# Patient Record
Sex: Male | Born: 1965 | Race: White | Hispanic: No | Marital: Single | Smoking: Former smoker
Health system: Southern US, Community
[De-identification: ages and names within clinical notes are randomized; demographics above are authoritative.]

## PROBLEM LIST (undated history)

## (undated) DIAGNOSIS — I10 Essential (primary) hypertension: Secondary | ICD-10-CM

## (undated) DIAGNOSIS — G4733 Obstructive sleep apnea (adult) (pediatric): Secondary | ICD-10-CM

---

## 2005-07-04 ENCOUNTER — Emergency Department (HOSPITAL_COMMUNITY): Admission: EM | Admit: 2005-07-04 | Discharge: 2005-07-04 | Payer: Self-pay | Admitting: Emergency Medicine

## 2006-12-25 IMAGING — CT CT ABDOMEN W/O CM
1 series · 15 of 32 positions shown, 19 images · non-contrast
Comparison: none

CLINICAL DATA: Right flank pain and dysuria.  
ABDOMEN CT WITHOUT CONTRAST ? 07/04/05:
TECHNIQUE: Multidetector CT imaging of the abdomen was performed following the standard protocol without IV contrast. 
No prior studies.
TECHNIQUE: Multidetector CT imaging of the pelvis was performed following the standard protocol without IV contrast.

[Series 9238: — · axial · 0.98mm/px · z∈[+1432,+1872]mm · 15 of 99 slices shown, 19 images]
[im 7/99  soft-tissue]
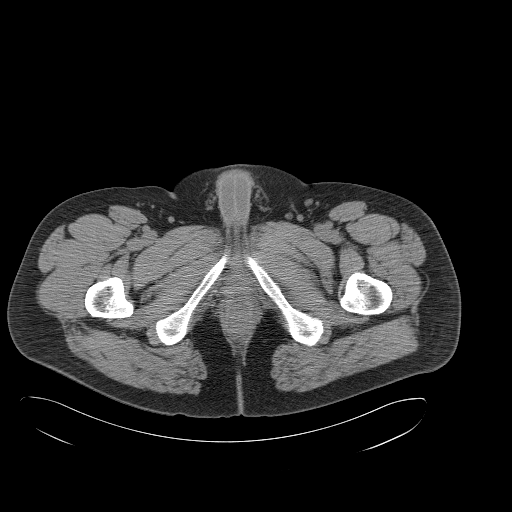
[im 7/99  bone]
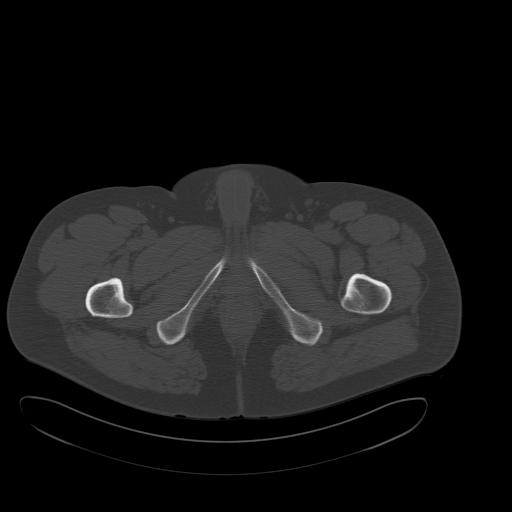
[im 13/99  soft-tissue]
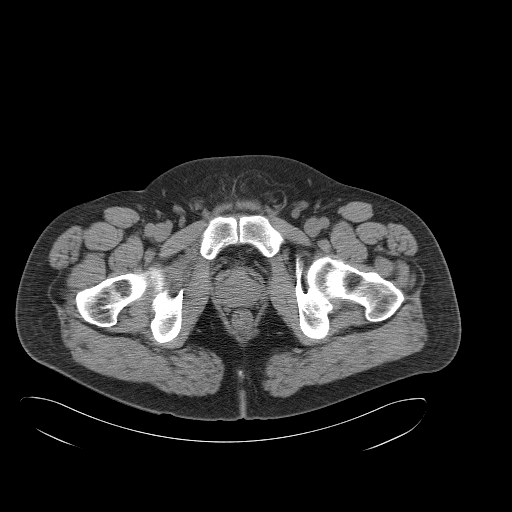
[im 19/99  soft-tissue]
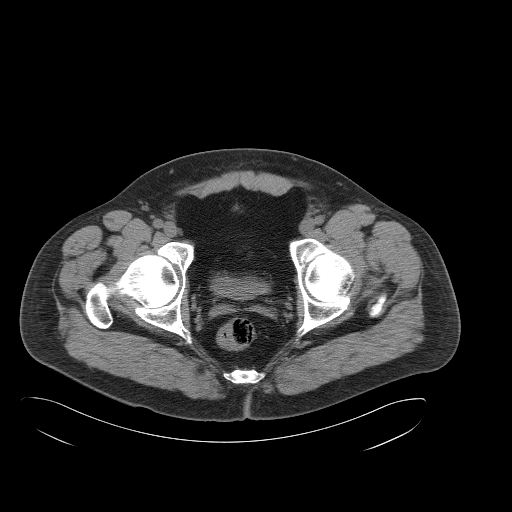
[im 29/99  soft-tissue]
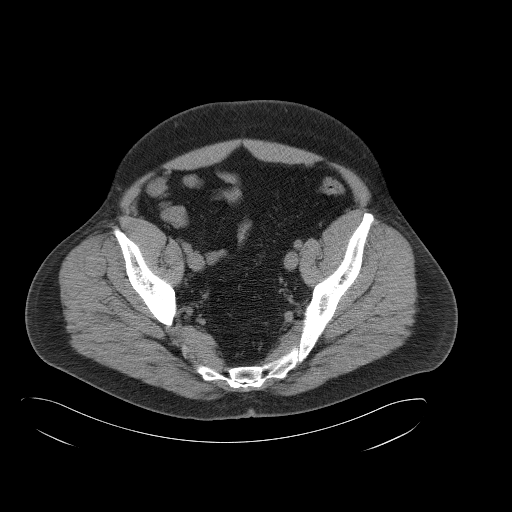
[im 35/99  soft-tissue]
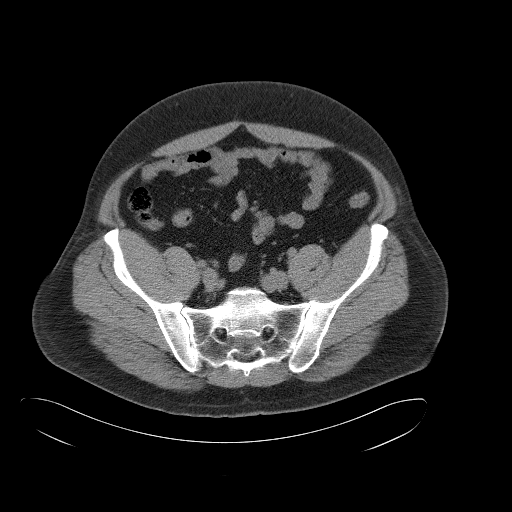
[im 42/99  soft-tissue]
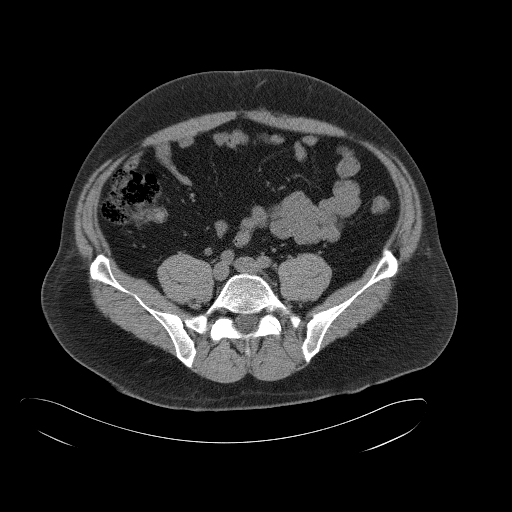
[im 51/99  soft-tissue]
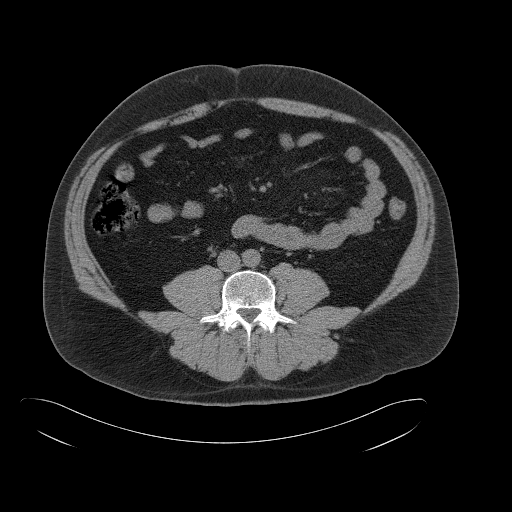
[im 57/99  soft-tissue]
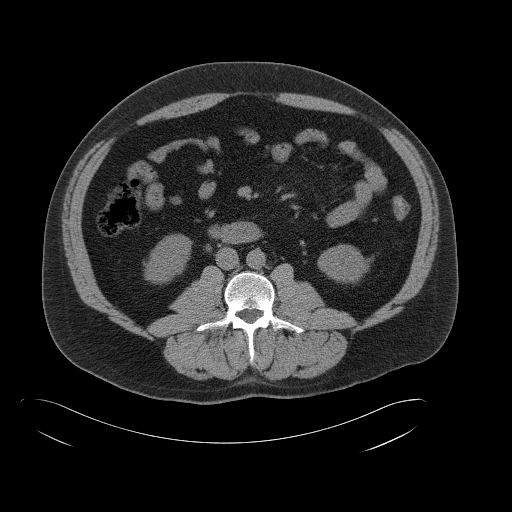
[im 64/99  soft-tissue]
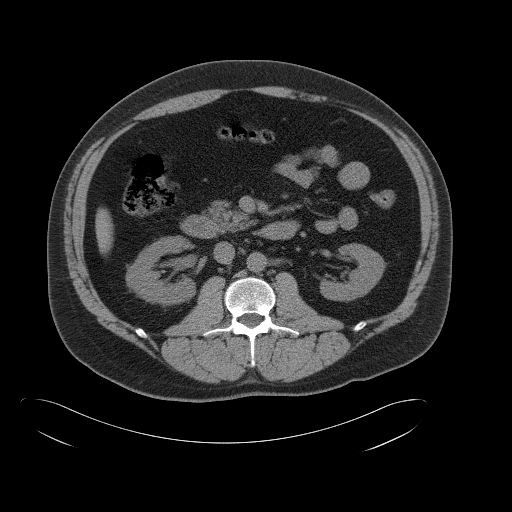
[im 64/99  bone]
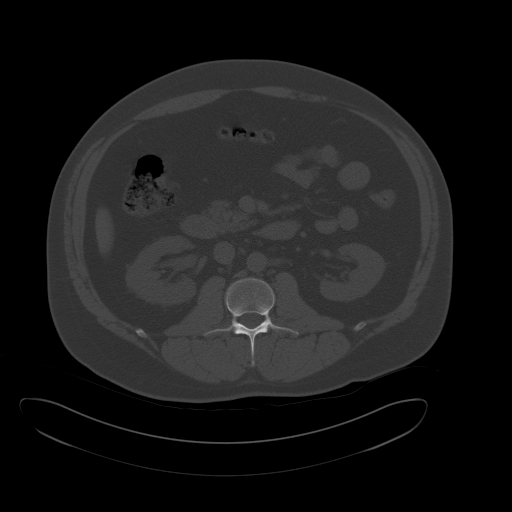
[im 70/99  soft-tissue]
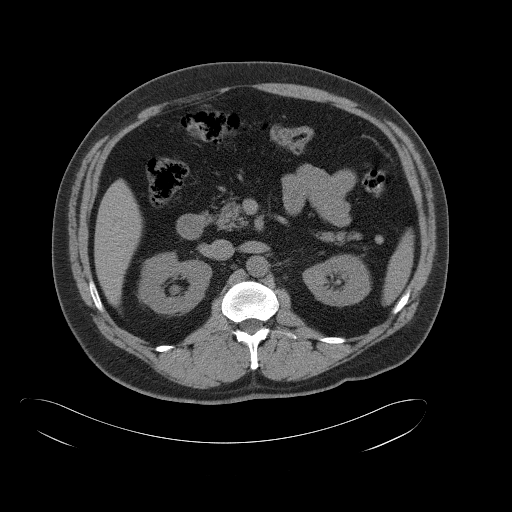
[im 80/99  soft-tissue]
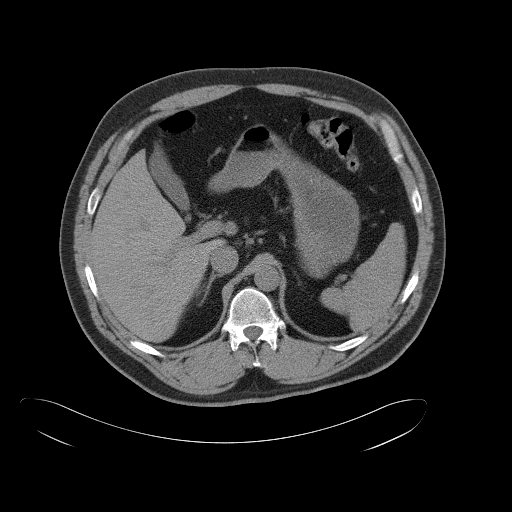
[im 86/99  soft-tissue]
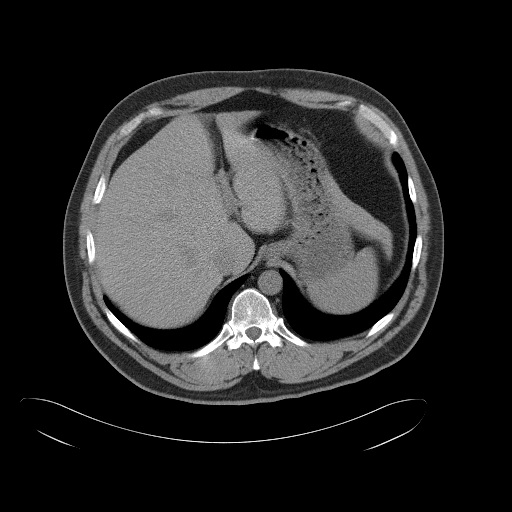
[im 86/99  lung]
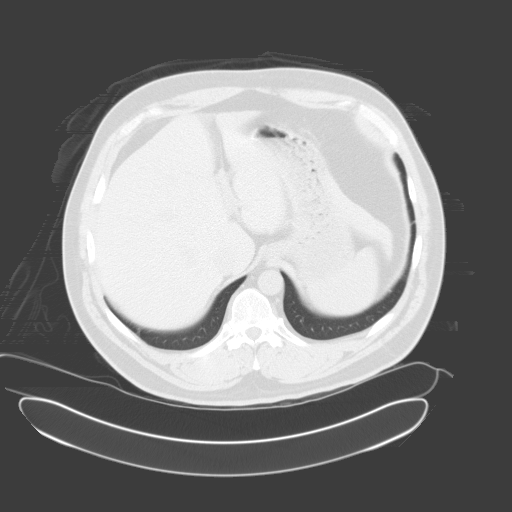
[im 89/99  lung]
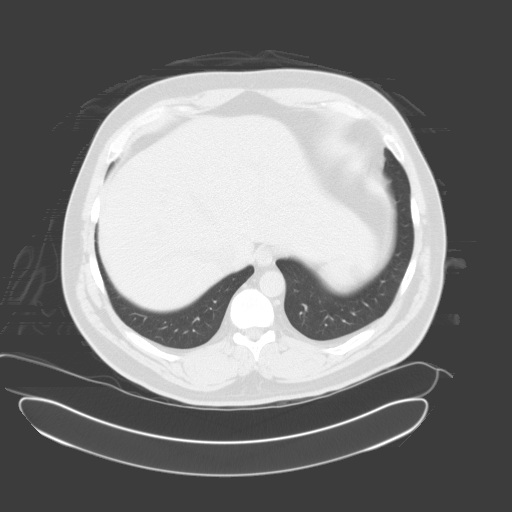
[im 92/99  soft-tissue]
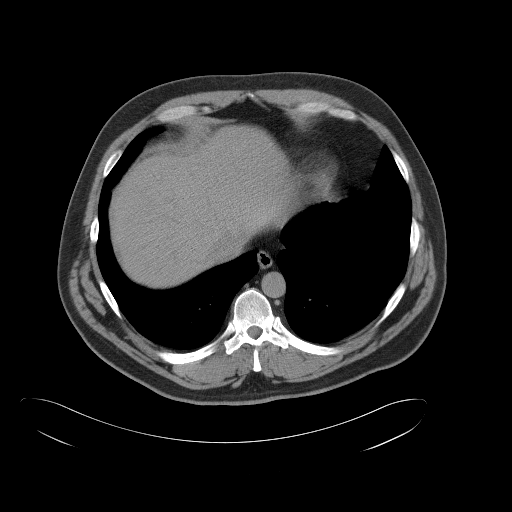
[im 92/99  lung]
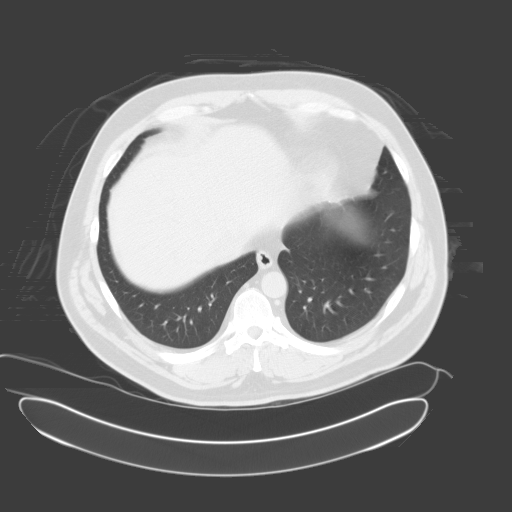
[im 95/99  lung]
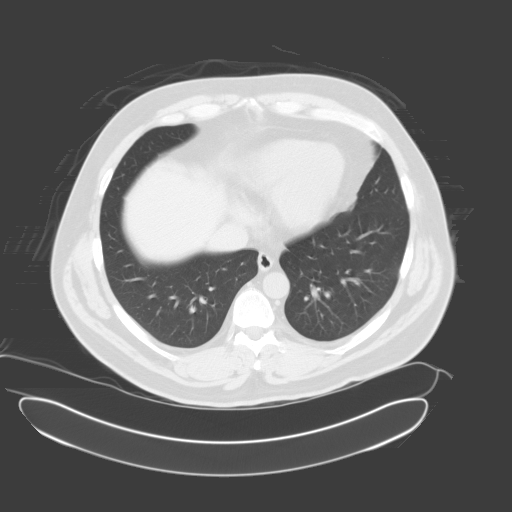

[15 of 32 positions shown; findings below may reference images not displayed]

FINDINGS: The liver, gallbladder, spleen, pancreas, and adrenal glands are unremarkable in the noncontrast CT appearance.  A celiac chain node measures 2.3 x 1.1 cm which is mildly enlarged.  A precaval node measures 3.0 x 0.9 cm.  
There is asymmetric right-sided perirenal and periureteral stranding along with mild right hydroureter extending down to a 1-2 mm right ureterovesical junction calculus.  No additional calculi are identified.  There is only mild fullness of the right collecting system.
IMPRESSION: 1.  Borderline right hydroureter due to a 1-2 mm partially obstructive right ureterovesical junction calculus.  There is mild right perirenal stranding but no overt hydronephrosis.
2.  Mildly enlarged upper abdominal lymph nodes, which may be reactive but are technically nonspecific.
PELVIS CT WITHOUT CONTRAST ? 07/04/05:
FINDINGS: The appendix appears normal.  There is borderline right hydroureter extending down to a very faint density at the right ureterovesical junction likely representing a 1-2 mm calculus.  The sigmoid colon appears unremarkable.  There is no significant inguinal or pelvic sidewall adenopathy.
IMPRESSION: 1.  1-2 mm partially obstructive right UVJ calculus.
2.  The appendix appears normal.

## 2008-03-07 ENCOUNTER — Encounter: Admission: RE | Admit: 2008-03-07 | Discharge: 2008-03-07 | Payer: Self-pay | Admitting: Gastroenterology

## 2009-08-28 IMAGING — US US ABDOMEN COMPLETE
1 series · 13 of 25 positions shown · non-contrast
Comparison: CT abdomen pelvis of 07/04/2005

CLINICAL DATA: C

ABDOMEN ULTRASOUND
TECHNIQUE: Complete abdominal ultrasound examination was performed
including evaluation of the liver, gallbladder, bile ducts,
pancreas, kidneys, spleen, IVC, and abdominal aorta.

[Series 1: us abdomen complete · 0.42mm/px · 13 of 80 slices shown]
[im 1/80]
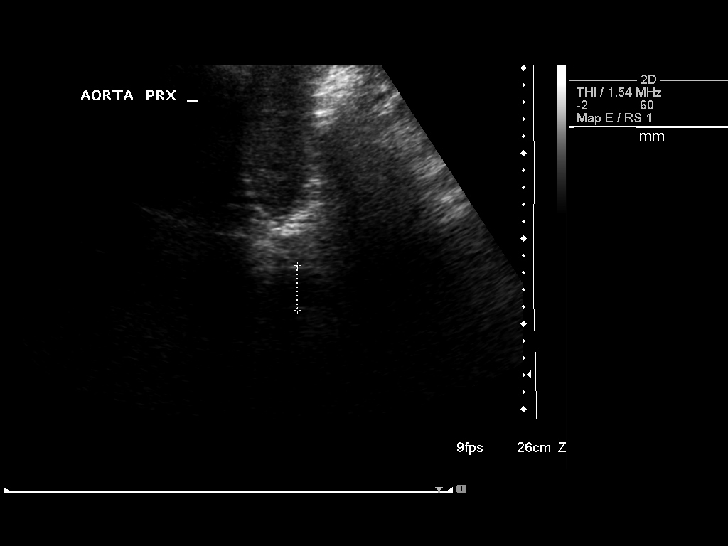
[im 7/80]
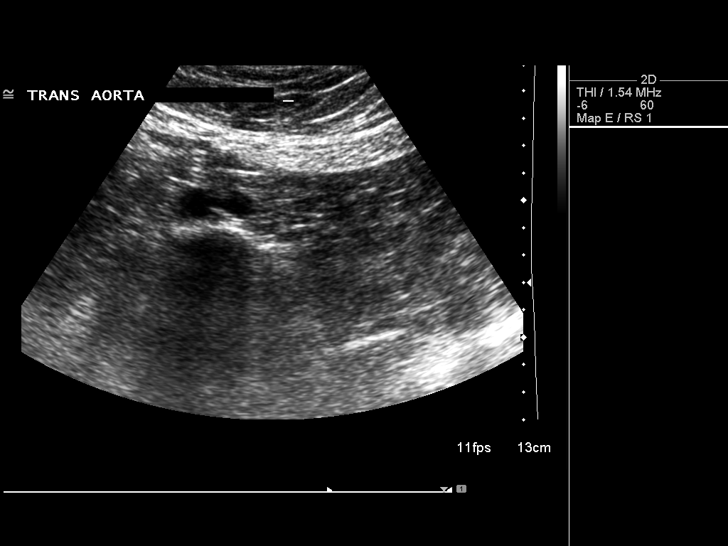
[im 14/80]
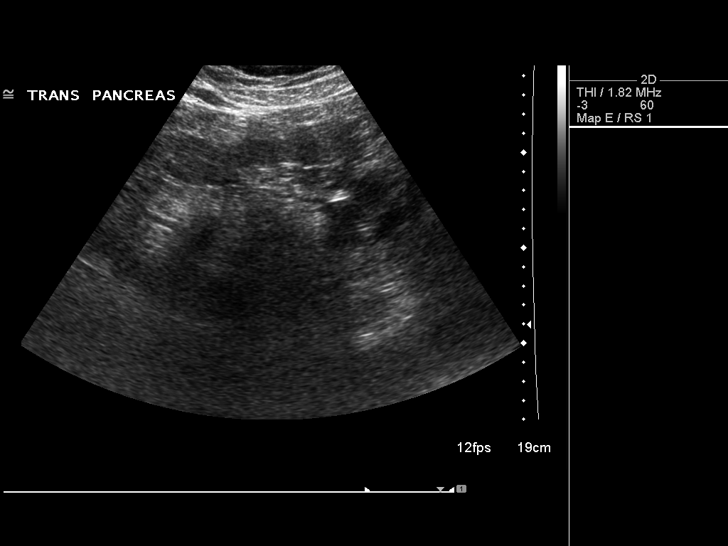
[im 20/80]
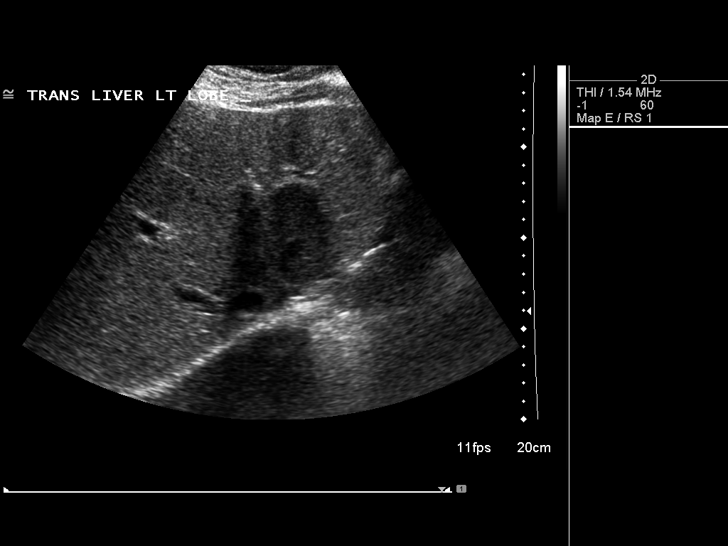
[im 27/80]
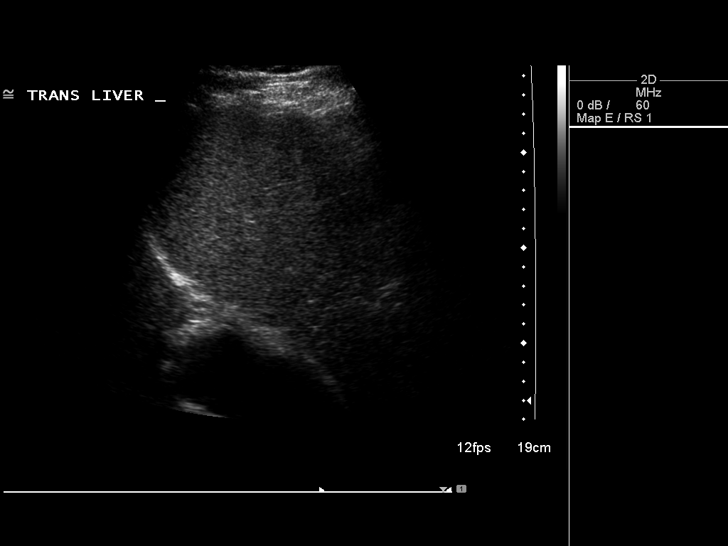
[im 33/80]
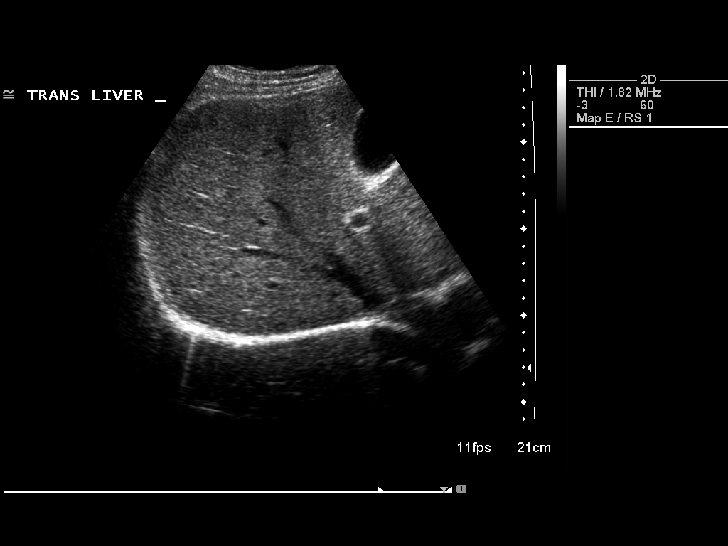
[im 40/80]
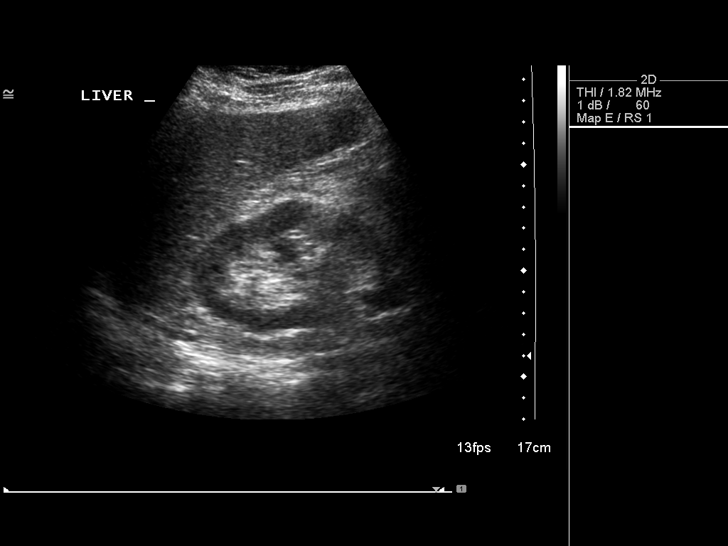
[im 47/80]
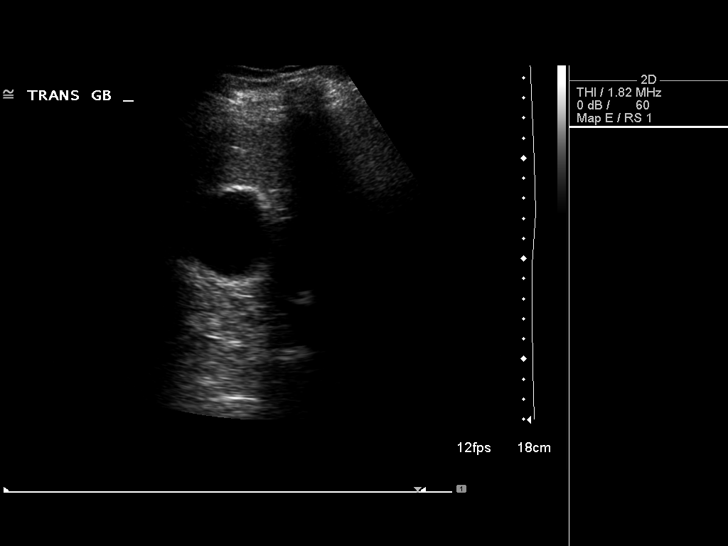
[im 53/80]
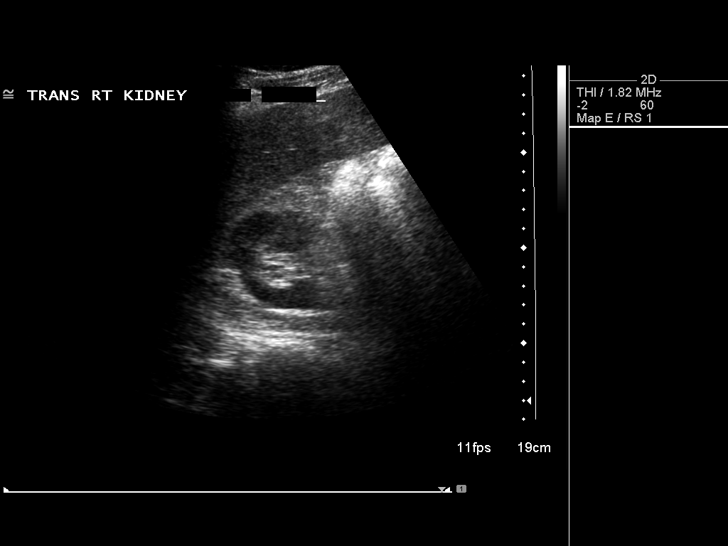
[im 60/80]
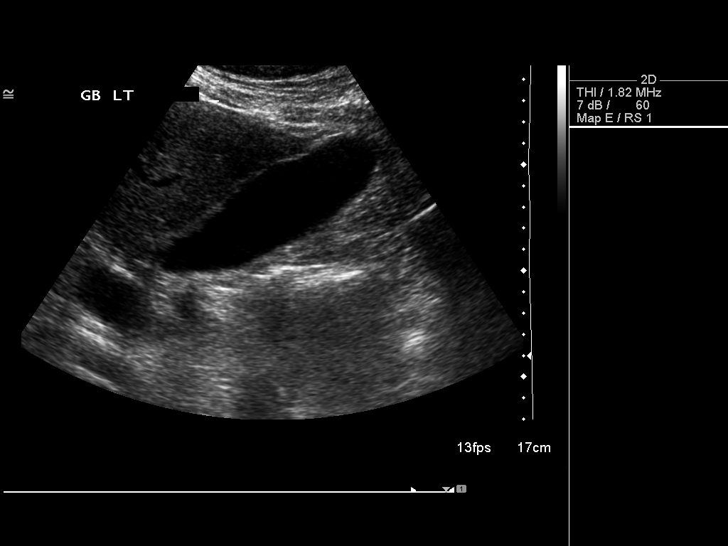
[im 66/80]
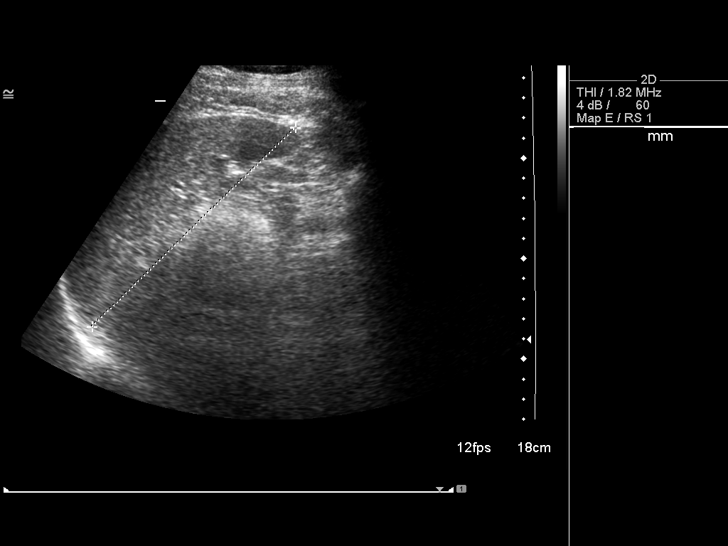
[im 73/80]
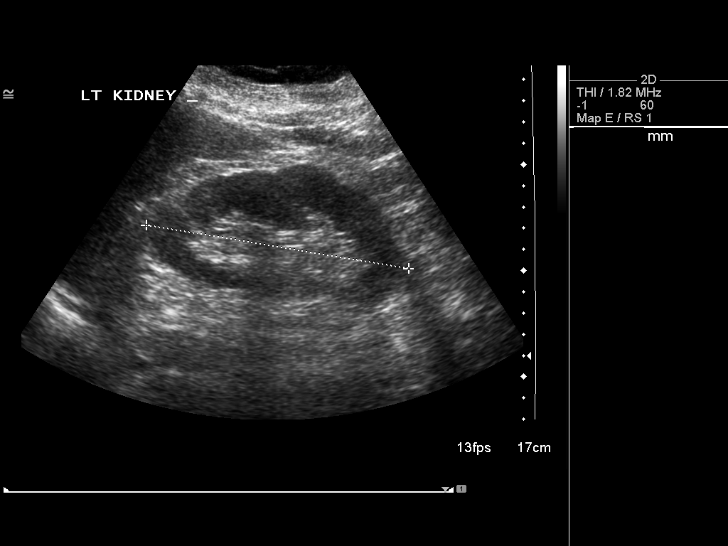
[im 80/80]
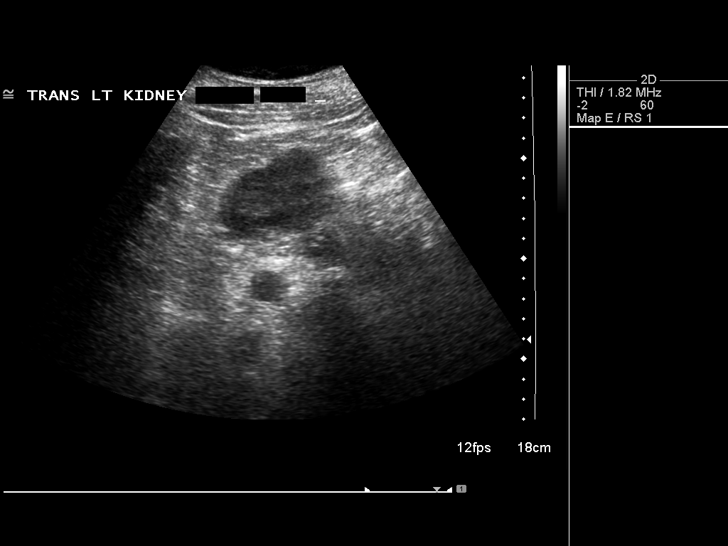

[13 of 25 positions shown; findings below may reference images not displayed]

FINDINGS: The gallbladder is well seen and no gallstones are noted.
The liver is somewhat prominent and inhomogeneous.  There is
increased echogenicity consistent with fatty infiltration.  No
focal abnormality is seen other than a small hypoechoic structure
in the medial aspect of the left lobe of liver most likely
representing a cyst.  This is not seen on the unenhanced CT abdomen
of 07/04/2005.  The common bile duct is normal measuring 3.8 mm in
diameter.  IVC appears normal.  The pancreas is partially obscured
by bowel gas.  The spleen is prominent measuring 14.2 x 14.5 cm.
No hydronephrosis is noted.  The right kidney measures 11.9 cm
sagittally with left kidney measuring 12.5 cm and the aorta is
partially obscured by bowel gas.
IMPRESSION: 1.  No gallstones.
2.  Mild hepatosplenomegaly with fatty infiltration of the liver.
No ductal dilatation or focal abnormality is evident other than
small hypoechoic structure of 7 mm in the left lobe most likely
representing cyst.
3.  Pancreas and abdominal aorta partially obscured by bowel gas.

## 2013-10-13 DIAGNOSIS — E785 Hyperlipidemia, unspecified: Secondary | ICD-10-CM | POA: Insufficient documentation

## 2013-10-13 DIAGNOSIS — E876 Hypokalemia: Secondary | ICD-10-CM | POA: Insufficient documentation

## 2013-10-13 DIAGNOSIS — I10 Essential (primary) hypertension: Secondary | ICD-10-CM | POA: Insufficient documentation

## 2017-03-12 DIAGNOSIS — F411 Generalized anxiety disorder: Secondary | ICD-10-CM | POA: Insufficient documentation

## 2018-04-21 DIAGNOSIS — Z86010 Personal history of colonic polyps: Secondary | ICD-10-CM | POA: Insufficient documentation

## 2021-09-15 ENCOUNTER — Ambulatory Visit (INDEPENDENT_AMBULATORY_CARE_PROVIDER_SITE_OTHER): Payer: BC Managed Care – PPO | Admitting: Internal Medicine

## 2021-09-15 VITALS — BP 160/95 | HR 86 | Resp 18 | Ht 70.0 in | Wt 231.0 lb

## 2021-09-15 DIAGNOSIS — Z9989 Dependence on other enabling machines and devices: Secondary | ICD-10-CM | POA: Insufficient documentation

## 2021-09-15 DIAGNOSIS — G4733 Obstructive sleep apnea (adult) (pediatric): Secondary | ICD-10-CM | POA: Diagnosis not present

## 2021-09-15 DIAGNOSIS — Z7189 Other specified counseling: Secondary | ICD-10-CM

## 2021-09-15 NOTE — Patient Instructions (Signed)

## 2021-09-15 NOTE — Progress Notes (Unsigned)
[facility] [address] [place], [place] [id number]  Pulmonary Sleep Medicine   Office Visit Note  Patient Name: Seth RighterGary W Schmidt DOB: 12/17/1965 MRN [id number]    Chief Complaint: Obstructive Sleep Apnea visit  Brief History:  Seth HiddenGary is seen today for initial care to establish care and for face to face notes for possible replacement.   The patient has a 2.5 year history of sleep apnea with initial symptoms and conditions of initial symptoms included choking/gasping for air,  Morning headaches, chronic nasal congestion,  snoring, sleepiness,  anxiety,  hypertension, hyperlipidemia. Patient is using PAP nightly and said he has lost 85lbs in past 14 months.  The patient feels real good after sleeping with PAP.  The patient reports benefiting from PAP use. Epworth Sleepiness Score is 12 out of 24. The patient does not take naps. The patient complains of the following: needing new machine  The compliance download shows  compliance with an average use time of 6:26  hours@ 90%. The AHI is 1.7  The patient does not complain of limb movements disrupting sleep.  ROS  General: (-) fever, (-) chills, (-) night sweat Nose and Sinuses: (-) nasal stuffiness or itchiness, (-) postnasal drip, (-) nosebleeds, (-) sinus trouble. Mouth and Throat: (-) sore throat, (-) hoarseness. Neck: (-) swollen glands, (-) enlarged thyroid, (-) neck pain. Respiratory: - cough, - shortness of breath, - wheezing. Neurologic: - numbness, - tingling. Psychiatric: + anxiety, - depression   Current Medication: Outpatient Encounter Medications as of 09/15/2021  Medication Sig   aspirin 81 MG chewable tablet Chew by mouth.   atorvastatin (LIPITOR) 20 MG tablet Take 20 mg by mouth daily.   sildenafil (REVATIO) 20 MG tablet Take 60 mg by mouth daily as needed.   telmisartan-hydrochlorothiazide (MICARDIS HCT) 80-12.5 MG tablet Take 1 tablet by mouth daily.   testosterone cypionate (DEPOTESTOSTERONE CYPIONATE) 200  MG/ML injection Inject 200 mg into the muscle every 14 (fourteen) days.   VASCEPA 1 g capsule Take 1 g by mouth daily.   venlafaxine XR (EFFEXOR-XR) 150 MG 24 hr capsule Take 150 mg by mouth daily.   WEGOVY 2.4 MG/0.75ML SOAJ Inject into the skin.   zolpidem (AMBIEN CR) 12.5 MG CR tablet Take 12.5 mg by mouth at bedtime as needed.   No facility-administered encounter medications on file as of 09/15/2021.    Surgical History: History reviewed. No pertinent surgical history.  Medical History: History reviewed. No pertinent past medical history.  Family History: Non contributory to the present illness  Social History: Social History   Socioeconomic History   Marital status: Single    Spouse name: Not on file   Number of children: Not on file   Years of education: Not on file   Highest education level: Not on file  Occupational History   Not on file  Tobacco Use   Smoking status: Former    Types: Cigarettes   Smokeless tobacco: Never  Substance and Sexual Activity   Alcohol use: Not on file   Drug use: Not on file   Sexual activity: Not on file  Other Topics Concern   Not on file  Social History Narrative   Not on file   Social Determinants of Health   Financial Resource Strain: Not on file  Food Insecurity: Not on file  Transportation Needs: Not on file  Physical Activity: Not on file  Stress: Not on file  Social Connections: Not on file  Intimate Partner Violence: Not on file  Vital Signs: Blood pressure (!) 160/95, pulse 86, resp. rate 18, height 5\' 10"  (1.778 m), weight 231 lb (104.8 kg), SpO2 98 %. Body mass index is 33.15 kg/m.    Examination: General Appearance: The patient is well-developed, well-nourished, and in no distress. Neck Circumference: 44.5 Skin: Gross inspection of skin unremarkable. Head: normocephalic, no gross deformities. Eyes: no gross deformities noted. ENT: ears appear grossly normal Neurologic: Alert and oriented. No  involuntary movements.    EPWORTH SLEEPINESS SCALE:  Scale:  (0)= no chance of dozing; (1)= slight chance of dozing; (2)= moderate chance of dozing; (3)= high chance of dozing  Chance  Situtation    Sitting and reading: 2    Watching TV: 2    Sitting Inactive in public: 2    As a passenger in car: 2      Lying down to rest: 3    Sitting and talking: 0    Sitting quielty after lunch: 0    In a car, stopped in traffic: 1   TOTAL SCORE:   12 out of 24    SLEEP STUDIES:  PSG 02/10/2019 - overall AHI 12.2,  Min SpO2 77%   CPAP COMPLIANCE DATA:  Date Range: 08/10/21 - 09/08/21  Average Daily Use: 6:26 hours  Median Use: 7:09 hours  Compliance for > 4 Hours: 90%  AHI: 1.7 respiratory events per hour  Days Used: 28/30  Mask Leak: 43.1lpm  95th Percentile Pressure: 5 - 20 cmH2O         LABS: No results found for this or any previous visit (from the past 2160 hour(s)).  Radiology: 2161 Abdomen Complete  Result Date: 03/07/2008 Clinical Data: C  ABDOMEN ULTRASOUND  Technique:  Complete abdominal ultrasound examination was performed including evaluation of the liver, gallbladder, bile ducts, pancreas, kidneys, spleen, IVC, and abdominal aorta.  Comparison: CT abdomen pelvis of 07/04/2005  Findings: The gallbladder is well seen and no gallstones are noted. The liver is somewhat prominent and inhomogeneous.  There is increased echogenicity consistent with fatty infiltration.  No focal abnormality is seen other than a small hypoechoic structure in the medial aspect of the left lobe of liver most likely representing a cyst.  This is not seen on the unenhanced CT abdomen of 07/04/2005.  The common bile duct is normal measuring 3.8 mm in diameter.  IVC appears normal.  The pancreas is partially obscured by bowel gas.  The spleen is prominent measuring 14.2 x 14.5 cm. No hydronephrosis is noted.  The right kidney measures 11.9 cm sagittally with left kidney measuring 12.5 cm  and the aorta is partially obscured by bowel gas.  IMPRESSION:  1.  No gallstones. 2.  Mild hepatosplenomegaly with fatty infiltration of the liver. No ductal dilatation or focal abnormality is evident other than small hypoechoic structure of 7 mm in the left lobe most likely representing cyst. 3.  Pancreas and abdominal aorta partially obscured by bowel gas. Provider: 07/06/2005   No results found.  No results found.    Assessment and Plan: Patient Active Problem List   Diagnosis Date Noted   OSA on CPAP 09/15/2021   CPAP use counseling 09/15/2021   History of adenomatous polyp of colon 04/21/2018   GAD (generalized anxiety disorder) 03/12/2017   Essential hypertension 10/13/2013   Diuretic-induced hypokalemia 10/13/2013   Hyperlipidemia 10/13/2013     1. OSA on CPAP The patient does tolerate PAP and reports  benefit from PAP use. Machine requires replacement. The patient was reminded  how to clean equipment and advised to replace supplies routinely. The patient was also counselled on weight loss. The compliance is very good. The AHI is 1.7.   OSA- replace machine. F/u 30d after set up.    2. CPAP use counseling CPAP Counseling: had a lengthy discussion with the patient regarding the importance of PAP therapy in management of the sleep apnea. Patient appears to understand the risk factor reduction and also understands the risks associated with untreated sleep apnea. Patient will try to make a good faith effort to remain compliant with therapy. Also instructed the patient on proper cleaning of the device including the water must be changed daily if possible and use of distilled water is preferred. Patient understands that the machine should be regularly cleaned with appropriate recommended cleaning solutions that do not damage the PAP machine for example given white vinegar and water rinses. Other methods such as ozone treatment may not be as good as these simple methods to achieve  cleaning.    General Counseling: I have discussed the findings of the evaluation and examination with Seth Schmidt.  I have also discussed any further diagnostic evaluation thatmay be needed or ordered today. Shawnte verbalizes understanding of the findings of todays visit. We also reviewed his medications today and discussed drug interactions and side effects including but not limited excessive drowsiness and altered mental states. We also discussed that there is always a risk not just to him but also people around him. he has been encouraged to call the office with any questions or concerns that should arise related to todays visit.  No orders of the defined types were placed in this encounter.       I have personally obtained a history, examined the patient, evaluated laboratory and imaging results, formulated the assessment and plan and placed orders. This patient was seen today by Seth Kluver, PA-C in collaboration with Dr. Freda Munro.   Yevonne Pax, MD [facility] Diplomate ABMS Pulmonary Critical Care Medicine and Sleep Medicine

## 2021-12-22 ENCOUNTER — Ambulatory Visit (INDEPENDENT_AMBULATORY_CARE_PROVIDER_SITE_OTHER): Payer: BC Managed Care – PPO | Admitting: Internal Medicine

## 2021-12-22 VITALS — BP 161/84 | HR 72 | Resp 16 | Ht 70.0 in | Wt 227.0 lb

## 2021-12-22 DIAGNOSIS — Z9989 Dependence on other enabling machines and devices: Secondary | ICD-10-CM | POA: Diagnosis not present

## 2021-12-22 DIAGNOSIS — G4733 Obstructive sleep apnea (adult) (pediatric): Secondary | ICD-10-CM

## 2021-12-22 DIAGNOSIS — I10 Essential (primary) hypertension: Secondary | ICD-10-CM

## 2021-12-22 DIAGNOSIS — Z7189 Other specified counseling: Secondary | ICD-10-CM | POA: Diagnosis not present

## 2021-12-22 NOTE — Progress Notes (Signed)
[facility] [address] [place], [place] [address]  Pulmonary Sleep Medicine   Office Visit Note  Patient Name: Seth Schmidt DOB: [birth year] MRN [id number]    Chief Complaint: Obstructive Sleep Apnea visit  Brief History:  Hezekiah is seen today for follow up. The patient has a 2.75 year history of sleep apnea. Patient is using PAP nightly with mediun N30i nasal mask..  The patient feels feels great after sleeping with PAP.   Epworth Sleepiness Score is 2 out of 24. The patient does not take naps. The patient complains of the following: no problems  The compliance download shows  compliance with an average use time of 6:17 hours @ 83%. The AHI is 0.8  The patient does not complain of limb movements disrupting sleep.  ROS  General: (-) fever, (-) chills, (-) night sweat Nose and Sinuses: (-) nasal stuffiness or itchiness, (-) postnasal drip, (-) nosebleeds, (-) sinus trouble. Mouth and Throat: (-) sore throat, (-) hoarseness. Neck: (-) swollen glands, (-) enlarged thyroid, (-) neck pain. Respiratory: - cough, - shortness of breath, - wheezing. Neurologic: - numbness, - tingling. Psychiatric: - anxiety, - depression   Current Medication: Outpatient Encounter Medications as of 12/22/2021  Medication Sig   atorvastatin (LIPITOR) 20 MG tablet atorvastatin 20 mg tablet  TAKE 1 TABLET BY MOUTH EVERY DAY   aspirin 81 MG chewable tablet Chew by mouth.   aspirin 81 MG chewable tablet Chew by mouth.   atorvastatin (LIPITOR) 20 MG tablet Take 20 mg by mouth daily.   sildenafil (REVATIO) 20 MG tablet Take 60 mg by mouth daily as needed.   tadalafil (CIALIS) 20 MG tablet Take 20 mg by mouth as needed.   telmisartan-hydrochlorothiazide (MICARDIS HCT) 80-12.5 MG tablet Take 1 tablet by mouth daily.   testosterone cypionate (DEPOTESTOSTERONE CYPIONATE) 200 MG/ML injection Inject 200 mg into the muscle every 14 (fourteen) days.   VASCEPA 1 g capsule Take 1 g by mouth daily.    venlafaxine XR (EFFEXOR-XR) 150 MG 24 hr capsule Take 150 mg by mouth daily.   WEGOVY 2.4 MG/0.75ML SOAJ Inject into the skin.   zolpidem (AMBIEN CR) 12.5 MG CR tablet Take 12.5 mg by mouth at bedtime as needed.   No facility-administered encounter medications on file as of 12/22/2021.    Surgical History: History reviewed. No pertinent surgical history.  Medical History: History reviewed. No pertinent past medical history.  Family History: Non contributory to the present illness  Social History: Social History   Socioeconomic History   Marital status: Single    Spouse name: Not on file   Number of children: Not on file   Years of education: Not on file   Highest education level: Not on file  Occupational History   Not on file  Tobacco Use   Smoking status: Former    Types: Cigarettes   Smokeless tobacco: Never  Substance and Sexual Activity   Alcohol use: Not on file   Drug use: Not on file   Sexual activity: Not on file  Other Topics Concern   Not on file  Social History Narrative   Not on file   Social Determinants of Health   Financial Resource Strain: Not on file  Food Insecurity: Not on file  Transportation Needs: Not on file  Physical Activity: Not on file  Stress: Not on file  Social Connections: Not on file  Intimate Partner Violence: Not on file    Vital Signs: Blood pressure (!) 161/84, pulse  72, resp. rate 16, height 5\' 10"  (1.778 m), weight 227 lb (103 kg), SpO2 98 %. Body mass index is 32.57 kg/m.    Examination: General Appearance: The patient is well-developed, well-nourished, and in no distress. Neck Circumference: 44.5 cm Skin: Gross inspection of skin unremarkable. Head: normocephalic, no gross deformities. Eyes: no gross deformities noted. ENT: ears appear grossly normal Neurologic: Alert and oriented. No involuntary movements.    EPWORTH SLEEPINESS SCALE:  Scale:  (0)= no chance of dozing; (1)= slight chance of dozing; (2)=  moderate chance of dozing; (3)= high chance of dozing  Chance  Situtation    Sitting and reading: 0    Watching TV: 0    Sitting Inactive in public: 0    As a passenger in car: 0      Lying down to rest: 2    Sitting and talking: 0    Sitting quielty after lunch: 0    In a car, stopped in traffic: 0   TOTAL SCORE:   2 out of 24    SLEEP STUDIES:  PSG 02/10/2019 - overall AHI 12.2,  Min SpO2 77%,  significant REM related obstructive sleep apnea,   CPAP COMPLIANCE DATA:  Date Range: 11/22/21 - 12/21/21  Average Daily Use: 6:17 hours  Median Use: 6:44 hours  Compliance for > 4 Hours: 83% days  AHI: 0.8 respiratory events per hour  Days Used: 30/30  Mask Leak: 32.5 lpm  95th Percentile Pressure: 10 cmH2O    LABS: No results found for this or any previous visit (from the past 2160 hour(s)).  Radiology: US Abdomen Complete  Result Date: 03/07/2008 Clinical Data: C  ABDOMEN ULTRASOUND  Technique:  Complete abdominal ultrasound examination was performed including evaluation of the liver, gallbladder, bile ducts, pancreas, kidneys, spleen, IVC, and abdominal aorta.  Comparison: CT abdomen pelvis of 07/04/2005  Findings: The gallbladder is well seen and no gallstones are noted. The liver is somewhat prominent and inhomogeneous.  There is increased echogenicity consistent with fatty infiltration.  No focal abnormality is seen other than a small hypoechoic structure in the medial aspect of the left lobe of liver most likely representing a cyst.  This is not seen on the unenhanced CT abdomen of 07/04/2005.  The common bile duct is normal measuring 3.8 mm in diameter.  IVC appears normal.  The pancreas is partially obscured by bowel gas.  The spleen is prominent measuring 14.2 x 14.5 cm. No hydronephrosis is noted.  The right kidney measures 11.9 cm sagittally with left kidney measuring 12.5 cm and the aorta is partially obscured by bowel gas.  IMPRESSION:  1.  No gallstones. 2.   Mild hepatosplenomegaly with fatty infiltration of the liver. No ductal dilatation or focal abnormality is evident other than small hypoechoic structure of 7 mm in the left lobe most likely representing cyst. 3.  Pancreas and abdominal aorta partially obscured by bowel gas. Provider: Luanne Bras   No results found.  No results found.    Assessment and Plan: Patient Active Problem List   Diagnosis Date Noted   OSA on CPAP 09/15/2021   CPAP use counseling 09/15/2021   History of adenomatous polyp of colon 04/21/2018   GAD (generalized anxiety disorder) 03/12/2017   Essential hypertension 10/13/2013   Diuretic-induced hypokalemia 10/13/2013   Hyperlipidemia 10/13/2013   1. OSA on CPAP The patient does tolerate PAP and reports  benefit from PAP use. The patient was reminded how to clean equipment and advised to replace supplies  routinely. The patient was also counselled on weight loss. The compliance is good. The AHI is 0.8.   OSA on cpap- CPAP continues to be medically necessary to treat this patient's OSA.  Continue with good compliance. F/u one year.    2. CPAP use counseling CPAP Counseling: had a lengthy discussion with the patient regarding the importance of PAP therapy in management of the sleep apnea. Patient appears to understand the risk factor reduction and also understands the risks associated with untreated sleep apnea. Patient will try to make a good faith effort to remain compliant with therapy. Also instructed the patient on proper cleaning of the device including the water must be changed daily if possible and use of distilled water is preferred. Patient understands that the machine should be regularly cleaned with appropriate recommended cleaning solutions that do not damage the PAP machine for example given white vinegar and water rinses. Other methods such as ozone treatment may not be as good as these simple methods to achieve cleaning.   3. Essential  hypertension Hypertension Counseling:   The following hypertensive lifestyle modification were recommended and discussed:  1. Limiting alcohol intake to less than 1 oz/day of ethanol:(24 oz of beer or 8 oz of wine or 2 oz of 100-proof whiskey). 2. Take baby ASA 81 mg daily. 3. Importance of regular aerobic exercise and losing weight. 4. Reduce dietary saturated fat and cholesterol intake for overall cardiovascular health. 5. Maintaining adequate dietary potassium, calcium, and magnesium intake. 6. Regular monitoring of the blood pressure. 7. Reduce sodium intake to less than 100 mmol/day (less than 2.3 gm of sodium or less than 6 gm of sodium choride)       General Counseling: I have discussed the findings of the evaluation and examination with Jillyn Hidden.  I have also discussed any further diagnostic evaluation thatmay be needed or ordered today. Jae verbalizes understanding of the findings of todays visit. We also reviewed his medications today and discussed drug interactions and side effects including but not limited excessive drowsiness and altered mental states. We also discussed that there is always a risk not just to him but also people around him. he has been encouraged to call the office with any questions or concerns that should arise related to todays visit.  No orders of the defined types were placed in this encounter.       I have personally obtained a history, examined the patient, evaluated laboratory and imaging results, formulated the assessment and plan and placed orders. This patient was seen today by Emmaline Kluver, PA-C in collaboration with Dr. Freda Munro.   Yevonne Pax, MD [facility] Diplomate ABMS Pulmonary Critical Care Medicine and Sleep Medicine

## 2021-12-22 NOTE — Patient Instructions (Signed)

## 2022-12-18 NOTE — Progress Notes (Signed)
Pt did not show for scheduled appointment.  

## 2022-12-21 ENCOUNTER — Ambulatory Visit: Payer: Self-pay | Admitting: Internal Medicine

## 2023-12-20 ENCOUNTER — Other Ambulatory Visit: Payer: Self-pay

## 2023-12-20 ENCOUNTER — Encounter (HOSPITAL_BASED_OUTPATIENT_CLINIC_OR_DEPARTMENT_OTHER): Payer: Self-pay

## 2023-12-20 ENCOUNTER — Emergency Department (HOSPITAL_BASED_OUTPATIENT_CLINIC_OR_DEPARTMENT_OTHER)
Admission: EM | Admit: 2023-12-20 | Discharge: 2023-12-21 | Disposition: A | Discharge status: Home / Self Care | Attending: Emergency Medicine | Admitting: Emergency Medicine

## 2023-12-20 ENCOUNTER — Emergency Department (HOSPITAL_BASED_OUTPATIENT_CLINIC_OR_DEPARTMENT_OTHER): Admitting: Radiology

## 2023-12-20 ENCOUNTER — Emergency Department (HOSPITAL_BASED_OUTPATIENT_CLINIC_OR_DEPARTMENT_OTHER)

## 2023-12-20 DIAGNOSIS — Z87891 Personal history of nicotine dependence: Secondary | ICD-10-CM | POA: Insufficient documentation

## 2023-12-20 DIAGNOSIS — M96840 Postprocedural hematoma of a musculoskeletal structure following a musculoskeletal system procedure: Secondary | ICD-10-CM | POA: Diagnosis not present

## 2023-12-20 DIAGNOSIS — R079 Chest pain, unspecified: Secondary | ICD-10-CM | POA: Diagnosis present

## 2023-12-20 LAB — BASIC METABOLIC PANEL WITH GFR
Anion gap: 15 (ref 5–15)
BUN: 15 mg/dL (ref 6–20)
CO2: 25 mmol/L (ref 22–32)
Calcium: 10.2 mg/dL (ref 8.9–10.3)
Chloride: 100 mmol/L (ref 98–111)
Creatinine, Ser: 1.03 mg/dL (ref 0.61–1.24)
GFR, Estimated: >60 mL/min (ref >60)
Glucose, Bld: 79 mg/dL (ref 70–99)
Potassium: 3.7 mmol/L (ref 3.5–5.1)
Sodium: 140 mmol/L (ref 135–145)

## 2023-12-20 LAB — CBC
HCT: 42.7 % (ref 39.0–52.0)
Hemoglobin: 14.8 g/dL (ref 13.0–17.0)
MCH: 32.5 pg (ref 26.0–34.0)
MCHC: 34.7 g/dL (ref 30.0–36.0)
MCV: 93.8 fL (ref 80.0–100.0)
Platelets: 202 10*3/uL (ref 150–400)
RBC: 4.55 MIL/uL (ref 4.22–5.81)
RDW: 13.8 % (ref 11.5–15.5)
WBC: 7.8 10*3/uL (ref 4.0–10.5)
nRBC: 0 % (ref 0.0–0.2)

## 2023-12-20 MED ORDER — IOHEXOL 350 MG/ML SOLN: 100.0000 mL | Freq: Once | INTRAVENOUS | Status: AC | PRN

## 2023-12-20 NOTE — ED Provider Notes (Signed)
 [facility] Emergency Department Provider Note MRN:  [id number]  Arrival date & time: 12/21/23     Chief Complaint   Chest pain History of Present Illness   Seth Schmidt is a 58 y.o. year-old male with a history of sleep apnea presenting to the ED with chief complaint of chest pain.  Patient is postop day 3 from [facility] for sleep apnea.  Folic he was recovering well for the past few days with some mild soreness to the right chest incision site.  Today had some increase in pain, some mild shortness of breath, and noticed bruising to this area of pain.  Review of Systems  A thorough review of systems was obtained and all systems are negative except as noted in the HPI and PMH.   Patient's Health History   History reviewed. No pertinent past medical history.  History reviewed. No pertinent surgical history.  History reviewed. No pertinent family history.  Social History   Socioeconomic History   Marital status: Single    Spouse name: Not on file   Number of children: Not on file   Years of education: Not on file   Highest education level: Not on file  Occupational History   Not on file  Tobacco Use   Smoking status: Former    Types: Cigarettes   Smokeless tobacco: Never  Substance and Sexual Activity   Alcohol use: Not on file   Drug use: Not on file   Sexual activity: Not on file  Other Topics Concern   Not on file  Social History Narrative   Not on file   Social Drivers of Health   Financial Resource Strain: Low Risk  (07/30/2023)   Received from [facility]   Overall Financial Resource Strain (CARDIA)    Difficulty of Paying Living Expenses: Not hard at all  Food Insecurity: No Food Insecurity (07/30/2023)   Received from [facility]   Hunger Vital Sign    Within the past 12 months, you worried that your food would run out before you got the money to buy more.: Never true    Within the past 12 months, the food you bought just  didn't last and you didn't have money to get more.: Never true  Transportation Needs: No Transportation Needs (07/30/2023)   Received from [facility] - [facility]    Lack of Transportation (Medical): No    Lack of Transportation (Non-Medical): No  Physical Activity: Unknown (01/31/2021)   Received from [facility]   Exercise Vital Sign    On average, how many days per week do you engage in moderate to strenuous exercise (like a brisk walk)?: 2 days    Minutes of Exercise per Session: Not on file  Stress: Not on file  Social Connections: Unknown (11/03/2021)   Received from [facility]   Social Network    Social Network: Not on file  Intimate Partner Violence: Unknown (10/06/2021)   Received from [facility]   HITS    Physically Hurt: Not on file    Insult or Talk Down To: Not on file    Threaten Physical Harm: Not on file    Scream or Curse: Not on file     Physical Exam   Vitals:   12/20/23 2118  BP: (!) 148/91  Pulse: 85  Resp: 20  Temp: 98.5 F (36.9 C)  SpO2: 96%    CONSTITUTIONAL: Well-appearing, NAD NEURO/PSYCH:  Alert and oriented x 3, no focal  deficits EYES:  eyes equal and reactive ENT/NECK:  no LAD, no JVD CARDIO: Regular rate, well-perfused, normal S1 and S2 PULM:  CTAB no wheezing or rhonchi GI/GU:  non-distended, non-tender MSK/SPINE:  No gross deformities, no edema SKIN: Bruising to the right lower anterior ribs   *Additional and/or pertinent findings included in MDM below  Diagnostic and Interventional Summary    EKG Interpretation Date/Time:  Monday December 20 2023 23:26:11 EDT Ventricular Rate:  82 PR Interval:  203 QRS Duration:  94 QT Interval:  377 QTC Calculation: 441 R Axis:   14  Text Interpretation: Sinus rhythm Borderline prolonged PR interval Abnormal R-wave progression, early transition Confirmed by Seth Schmidt [phone]) on 12/21/2023 12:59:00 AM       Labs Reviewed  BASIC METABOLIC PANEL WITH GFR  CBC   TROPONIN T, HIGH SENSITIVITY  TROPONIN T, HIGH SENSITIVITY    CT Angio Chest Pulmonary Embolism (PE) W or WO Contrast  Final Result    DG Chest 2 View  Final Result      Medications  iohexol (OMNIPAQUE) 350 MG/ML injection 100 mL (75 mLs Intravenous Contrast Given 12/20/23 2334)     Procedures  /  Critical Care Procedures  ED Course and Medical Decision Making  Initial Impression and Ddx Differential diagnosis includes postoperative bleeding, PE  Past medical/surgical history that increases complexity of ED encounter: Sleep apnea  Interpretation of Diagnostics I personally reviewed the EKG and my interpretation is as follows: Sinus rhythm without concerning ischemic findings  No significant blood count or electrolyte disturbance.  Troponin negative  Patient Reassessment and Ultimate Disposition/Management     CT revealing no evidence of PE, there is evidence of hematoma.  Radiology questioning the presence of a diffuse cellulitis as the cause of the abnormal appearance of the soft tissues.  Clinically I do not think this fits at all.  I suspect the soft tissue findings are related to underlying hematoma or seroma.  He has no fever, he has no outward signs of infection, no tenderness.  Patient made aware of this remote possibility and will monitor for symptoms of infection.  I see no great benefit in emergently consulting ENT at this time as there is no signs of active bleeding or emergent process to correct.  I will message his surgeon and he will follow-up in the office.  Appropriate for discharge.  Patient management required discussion with the following services or consulting groups:  None  Complexity of Problems Addressed Acute illness or injury that poses threat of life of bodily function  Additional Data Reviewed and Analyzed Further history obtained from: Further history from spouse/family member  Additional Factors Impacting ED Encounter Risk Consideration of  hospitalization  Seth Abe. Harless Lien, MD [facility] Emergency Medicine [facility] [email or web address]  Final Clinical Impressions(s) / ED Diagnoses     ICD-10-CM   1. Postoperative hematoma of musculoskeletal structure following musculoskeletal procedure  M96.840       ED Discharge Orders     None        Discharge Instructions Discussed with and Provided to Patient:     Discharge Instructions      You were evaluated in the Emergency Department and after careful evaluation, we did not find any emergent condition requiring admission or further testing in the hospital.  Your exam/testing today is overall reassuring.  Symptoms likely due to a hematoma within the soft tissues following your surgery.  Recommend calling your surgeons office later today to let them  know.  We will message Dr. Tellis Feathers so he is aware.  Please return to the Emergency Department if you experience any worsening of your condition.   Thank you for allowing us  to be a part of your care.       Seth Graces, MD 12/21/23 0100

## 2023-12-21 LAB — TROPONIN T, HIGH SENSITIVITY: Troponin T High Sensitivity: <15 ng/L (ref <19)

## 2023-12-21 NOTE — Discharge Instructions (Addendum)
 You were evaluated in the Emergency Department and after careful evaluation, we did not find any emergent condition requiring admission or further testing in the hospital.  Your exam/testing today is overall reassuring.  Symptoms likely due to a hematoma within the soft tissues following your surgery.  Recommend calling your surgeons office later today to let them know.  We will message Dr. Tellis Feathers so he is aware.  Please return to the Emergency Department if you experience any worsening of your condition.   Thank you for allowing us  to be a part of your care.

## 2024-02-08 ENCOUNTER — Encounter: Payer: Self-pay | Admitting: Podiatry

## 2024-02-08 ENCOUNTER — Ambulatory Visit: Admitting: Podiatry

## 2024-02-08 ENCOUNTER — Ambulatory Visit (INDEPENDENT_AMBULATORY_CARE_PROVIDER_SITE_OTHER): Admitting: Podiatry

## 2024-02-08 DIAGNOSIS — M79671 Pain in right foot: Secondary | ICD-10-CM | POA: Diagnosis not present

## 2024-02-08 DIAGNOSIS — M79672 Pain in left foot: Secondary | ICD-10-CM | POA: Diagnosis not present

## 2024-02-08 DIAGNOSIS — L600 Ingrowing nail: Secondary | ICD-10-CM | POA: Diagnosis not present

## 2024-02-08 NOTE — Patient Instructions (Signed)

## 2024-02-08 NOTE — Progress Notes (Signed)
 Patient complains of painful ingrown both border(s) toe hallux bilaterally.  Has been bothering him for years and has been digging them out pulling them off himself.  Patient denies fevers, chills, nausea, vomiting.  Objective:  Vitals: Reviewed  General: Well developed, nourished, in no acute distress, alert and oriented x3   Vascular: DP pulse 2/4 bilateral. PT pulse 2/4 bilateral. Mild  edema hallux b/l  Dermatology: Erythema, edema, incurvated, gryphotic  nail hallux bilaterally with no drainage . Tenderness present with palpation. Normal skin tone and texture feet with normal hair growth.  Neurological: Grossly intact. Normal reflexes.   Musculoskeletal: Tenderness with palpation of the distal hallux bilaterally. No tenderness or painful ROM at IPJ.  Diagnosis: 1.  Pain feet bilaterally 2. Ingrown nail hallux bilaterally  Plan: -New patient office visit level 3 for evaluation and management.  Modifier 25. -discussed etiology and treatment of ingrown nails. Discussed surgical vs conservative treatment.  Will do left hallux nail today and right in 2 weeks. -Consent signed for appropriate matrixectomy affected nail(s).   Procedure(s):   - Matrixectomy(s) total nail hallux LT: Toe anesthetized with 3cc 2:1 mixture 2% Lidocaine with epinephrine: Sodium Bicarbonate. Surgical site prepped. Digital tourniquet applied.  Avulsion of nail . performed. Matrixecomy performed with three 30 second applications of phenol to nail matrix. Site irrigated with alcohol.  Tourniquet released with good vascularity noticed in digit.  Applied triple antibiotic to nailbed and applied gauze and Coban dressing. - Written and oral postoperative instructions given.  -Return for post-op 2 weeks and matricectomy hallux nail right  J Prentice Binder, DPM

## 2024-02-22 ENCOUNTER — Encounter: Payer: Self-pay | Admitting: Podiatry

## 2024-02-22 ENCOUNTER — Ambulatory Visit (INDEPENDENT_AMBULATORY_CARE_PROVIDER_SITE_OTHER): Admitting: Podiatry

## 2024-02-22 DIAGNOSIS — L600 Ingrowing nail: Secondary | ICD-10-CM | POA: Diagnosis not present

## 2024-02-22 NOTE — Progress Notes (Signed)
 Patient complains of painful ingrown both hallux border(s). Patient denies fevers, chills, nausea, vomiting.  No problems with nail surgery on the hallux left  Objective:  Vitals: Reviewed  General: Well developed, nourished, in no acute distress, alert and oriented x3   Vascular: DP pulse 2/4 bilateral. PT pulse 2 to/4 bilateral.  Mild edema hallux right  Dermatology: Erythema, edema, incurvated nail border both hallux right with no drainage . Tenderness present with palpation. Normal skin tone and texture feet with normal hair growth.  Neurological: Grossly intact. Normal reflexes.   Musculoskeletal: Tenderness with palpation of the distal right. No tenderness or painful ROM at IPJ.  Diagnosis: Ingrown nail hallux right  Plan: -discussed etiology and treatment of ingrown nails. Discussed surgical vs conservative treatment. -Consent signed for appropriate matrixectomy affected nail(s).   Procedure(s):   - Matrixectomy(s) total nail hallux right: Toe anesthetized with 3cc 2:1 mixture 2% Lidocaine with epinephrine: Sodium Bicarbonate. Surgical site prepped. Digital tourniquet applied.  Avulsion of nail plate. performed. Matrixecomy performed with three 30 second applications of phenol to nail matrix. Site irrigated with alcohol.  Tourniquet released with good vascularity noticed in digit.  Applied triple antibiotic to nailbed and applied gauze and Coban dressing. - Written and oral postoperative instructions given.  -Return for post-op 2 weeks.  JINNY Prentice Binder, DPM

## 2024-02-22 NOTE — Patient Instructions (Signed)

## 2024-03-29 ENCOUNTER — Other Ambulatory Visit: Payer: Self-pay

## 2024-03-29 ENCOUNTER — Inpatient Hospital Stay (HOSPITAL_BASED_OUTPATIENT_CLINIC_OR_DEPARTMENT_OTHER)
Admission: EM | Admit: 2024-03-29 | Discharge: 2024-04-01 | DRG: 390 | Disposition: A | Discharge status: Home / Self Care | Attending: Internal Medicine | Admitting: Internal Medicine

## 2024-03-29 ENCOUNTER — Encounter (HOSPITAL_BASED_OUTPATIENT_CLINIC_OR_DEPARTMENT_OTHER): Payer: Self-pay

## 2024-03-29 DIAGNOSIS — K56609 Unspecified intestinal obstruction, unspecified as to partial versus complete obstruction: Secondary | ICD-10-CM | POA: Diagnosis present

## 2024-03-29 DIAGNOSIS — Z79890 Hormone replacement therapy (postmenopausal): Secondary | ICD-10-CM

## 2024-03-29 DIAGNOSIS — K56600 Partial intestinal obstruction, unspecified as to cause: Secondary | ICD-10-CM | POA: Diagnosis not present

## 2024-03-29 DIAGNOSIS — Z7982 Long term (current) use of aspirin: Secondary | ICD-10-CM

## 2024-03-29 DIAGNOSIS — I10 Essential (primary) hypertension: Secondary | ICD-10-CM | POA: Diagnosis present

## 2024-03-29 DIAGNOSIS — Z20822 Contact with and (suspected) exposure to covid-19: Secondary | ICD-10-CM | POA: Diagnosis present

## 2024-03-29 DIAGNOSIS — Z860100 Personal history of colon polyps, unspecified: Secondary | ICD-10-CM

## 2024-03-29 DIAGNOSIS — F419 Anxiety disorder, unspecified: Secondary | ICD-10-CM | POA: Diagnosis present

## 2024-03-29 DIAGNOSIS — Z1152 Encounter for screening for COVID-19: Secondary | ICD-10-CM

## 2024-03-29 DIAGNOSIS — Z8619 Personal history of other infectious and parasitic diseases: Secondary | ICD-10-CM

## 2024-03-29 DIAGNOSIS — D72829 Elevated white blood cell count, unspecified: Secondary | ICD-10-CM | POA: Diagnosis present

## 2024-03-29 DIAGNOSIS — Z79899 Other long term (current) drug therapy: Secondary | ICD-10-CM

## 2024-03-29 DIAGNOSIS — G4733 Obstructive sleep apnea (adult) (pediatric): Secondary | ICD-10-CM | POA: Diagnosis present

## 2024-03-29 DIAGNOSIS — G4700 Insomnia, unspecified: Secondary | ICD-10-CM | POA: Diagnosis present

## 2024-03-29 DIAGNOSIS — Z87891 Personal history of nicotine dependence: Secondary | ICD-10-CM

## 2024-03-29 DIAGNOSIS — R1084 Generalized abdominal pain: Secondary | ICD-10-CM | POA: Diagnosis not present

## 2024-03-29 HISTORY — DX: Essential (primary) hypertension: I10

## 2024-03-29 HISTORY — DX: Obstructive sleep apnea (adult) (pediatric): G47.33

## 2024-03-29 LAB — COMPREHENSIVE METABOLIC PANEL WITH GFR
ALT: 19 U/L (ref 0–44)
AST: 23 U/L (ref 15–41)
Albumin: 4.8 g/dL (ref 3.5–5.0)
Alkaline Phosphatase: 67 U/L (ref 38–126)
Anion gap: 17 — ABNORMAL HIGH (ref 5–15)
BUN: 11 mg/dL (ref 6–20)
CO2: 22 mmol/L (ref 22–32)
Calcium: 10.3 mg/dL (ref 8.9–10.3)
Chloride: 98 mmol/L (ref 98–111)
Creatinine, Ser: 1.15 mg/dL (ref 0.61–1.24)
GFR, Estimated: >60 mL/min (ref >60)
Glucose, Bld: 111 mg/dL — ABNORMAL HIGH (ref 70–99)
Potassium: 3.7 mmol/L (ref 3.5–5.1)
Sodium: 138 mmol/L (ref 135–145)
Total Bilirubin: 0.7 mg/dL (ref 0.0–1.2)
Total Protein: 7.5 g/dL (ref 6.5–8.1)

## 2024-03-29 LAB — CBC
HCT: 50.5 % (ref 39.0–52.0)
Hemoglobin: 18.5 g/dL — ABNORMAL HIGH (ref 13.0–17.0)
MCH: 33.3 pg (ref 26.0–34.0)
MCHC: 36.6 g/dL — ABNORMAL HIGH (ref 30.0–36.0)
MCV: 90.8 fL (ref 80.0–100.0)
Platelets: 241 K/uL (ref 150–400)
RBC: 5.56 MIL/uL (ref 4.22–5.81)
RDW: 12.5 % (ref 11.5–15.5)
WBC: 17.5 K/uL — ABNORMAL HIGH (ref 4.0–10.5)
nRBC: 0 % (ref 0.0–0.2)

## 2024-03-29 LAB — TROPONIN T, HIGH SENSITIVITY: Troponin T High Sensitivity: <15 ng/L (ref 0–19)

## 2024-03-29 LAB — LIPASE, BLOOD: Lipase: 52 U/L — ABNORMAL HIGH (ref 11–51)

## 2024-03-29 MED ORDER — METOCLOPRAMIDE HCL 5 MG/ML IJ SOLN
10.0000 mg | Freq: Once | INTRAMUSCULAR | Status: AC
  Administered 2024-03-29: 10 mg via INTRAVENOUS
  Filled 2024-03-29: qty 2

## 2024-03-29 MED ORDER — HYDROMORPHONE HCL 1 MG/ML IJ SOLN
1.0000 mg | Freq: Once | INTRAMUSCULAR | Status: AC
  Administered 2024-03-29: 1 mg via INTRAVENOUS
  Filled 2024-03-29: qty 1

## 2024-03-29 MED ORDER — SODIUM CHLORIDE 0.9 % IV BOLUS
1000.0000 mL | Freq: Once | INTRAVENOUS | Status: AC
  Administered 2024-03-29: 1000 mL via INTRAVENOUS

## 2024-03-29 MED ORDER — ONDANSETRON HCL 4 MG/2ML IJ SOLN
4.0000 mg | Freq: Once | INTRAMUSCULAR | Status: AC
  Administered 2024-03-29: 4 mg via INTRAVENOUS
  Filled 2024-03-29: qty 2

## 2024-03-29 NOTE — ED Provider Notes (Signed)
 [place] EMERGENCY DEPARTMENT AT [facility] Provider Note   CSN: [id number] Arrival date & time: 03/29/24  2308     Patient presents with: Abdominal Pain   Seth Schmidt is a 58 y.o. male.  {Add pertinent medical, surgical, social history, OB history to YEP:67052} Presents with multiple complaints.  Started to not feel well yesterday.  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  Today he developed a headache followed by severe diffuse abdominal pain, nausea and vomiting.       Prior to Admission medications   Medication Sig Start Date End Date Taking? Authorizing Provider  aspirin 81 MG chewable tablet Chew by mouth.    [provider]  aspirin 81 MG chewable tablet Chew by mouth.    [provider]  atorvastatin  (LIPITOR) 20 MG tablet Take 20 mg by mouth daily. 08/30/21   [provider]  atorvastatin  (LIPITOR) 20 MG tablet atorvastatin  20 mg tablet  TAKE 1 TABLET BY MOUTH EVERY DAY 03/27/21   [provider]  sildenafil (REVATIO) 20 MG tablet Take 60 mg by mouth daily as needed. 06/02/21   [provider]  tadalafil (CIALIS) 20 MG tablet Take 20 mg by mouth as needed. 12/12/21   [provider]  telmisartan-hydrochlorothiazide  (MICARDIS HCT) 80-12.5 MG tablet Take 1 tablet by mouth daily. 08/11/21   [provider]  testosterone cypionate (DEPOTESTOSTERONE CYPIONATE) 200 MG/ML injection Inject 200 mg into the muscle every 14 (fourteen) days. 07/08/21   [provider]  VASCEPA 1 g capsule Take 1 g by mouth daily. 06/18/21   [provider]  venlafaxine XR (EFFEXOR-XR) 150 MG 24 hr capsule Take 150 mg by mouth daily. 09/08/21   [provider]  WEGOVY 2.4 MG/0.75ML SOAJ Inject into the skin. 09/09/21   [provider]  zolpidem (AMBIEN CR) 12.5 MG CR tablet Take 12.5 mg by mouth at bedtime as needed. 08/28/21   [provider]     Allergies: Patient has no known allergies.    Review of Systems  Updated Vital Signs BP (!) 163/86   Pulse 88   Temp 98.5 F (36.9 C) (Oral)   Resp 20   SpO2 100%   Physical Exam Vitals and nursing note reviewed.  Constitutional:      General: He is not in acute distress.    Appearance: He is well-developed.  HENT:     Head: Normocephalic and atraumatic.     Mouth/Throat:     Mouth: Mucous membranes are moist.  Eyes:     General: Vision grossly intact. Gaze aligned appropriately.     Extraocular Movements: Extraocular movements intact.     Conjunctiva/sclera: Conjunctivae normal.  Cardiovascular:     Rate and Rhythm: Normal rate and regular rhythm.     Pulses: Normal pulses.     Heart sounds: Normal heart sounds, S1 normal and S2 normal. No murmur heard.    No friction rub. No gallop.  Pulmonary:     Effort: Pulmonary effort is normal. No respiratory distress.     Breath sounds: Normal breath sounds.  Abdominal:     Palpations: Abdomen is soft.     Tenderness: There is generalized abdominal tenderness. There is no guarding or rebound.     Hernia: No hernia is present.  Musculoskeletal:        General: No swelling.     Cervical back: Full passive range of motion without pain, normal range of  motion and neck supple. No pain with movement, spinous process tenderness or muscular tenderness. Normal range of motion.     Right lower leg: No edema.     Left lower leg: No edema.  Skin:    General: Skin is warm and dry.     Capillary Refill: Capillary refill takes less than 2 seconds.     Findings: No ecchymosis, erythema, lesion or wound.  Neurological:     Mental Status: He is alert and oriented to person, place, and time.     GCS: GCS eye subscore is 4. GCS verbal subscore is 5. GCS motor subscore is 6.     Cranial Nerves: Cranial nerves 2-12 are intact.     Sensory: Sensation is intact.     Motor: Motor function is intact. No weakness or abnormal muscle tone.      Coordination: Coordination is intact.  Psychiatric:        Mood and Affect: Mood normal.        Speech: Speech normal.        Behavior: Behavior normal.     (all labs ordered are listed, but only abnormal results are displayed) Labs Reviewed  CBC - Abnormal; Notable for the following components:      Result Value   WBC 17.5 (*)    Hemoglobin 18.5 (*)    MCHC 36.6 (*)    All other components within normal limits  RESP PANEL BY RT-PCR (RSV, FLU A&B, COVID)  RVPGX2  LIPASE, BLOOD  COMPREHENSIVE METABOLIC PANEL WITH GFR  URINALYSIS, ROUTINE W REFLEX MICROSCOPIC  TROPONIN T, HIGH SENSITIVITY    EKG: EKG Interpretation Date/Time:  Wednesday March 29 2024 23:17:04 EDT Ventricular Rate:  76 PR Interval:  195 QRS Duration:  102 QT Interval:  489 QTC Calculation: 550 R Axis:   1  Text Interpretation: Sinus rhythm Supraventricular bigeminy ST elevation, consider inferior injury Prolonged QT interval No significant change since last tracing Confirmed by Haze Lonni PARAS [phone]) on 03/29/2024 11:18:44 PM  Radiology: No results found.  {Document cardiac monitor, telemetry assessment procedure when appropriate:32947} Procedures   Medications Ordered in the ED  sodium chloride  0.9 % bolus 1,000 mL (has no administration in time range)  HYDROmorphone  (DILAUDID ) injection 1 mg (has no administration in time range)  ondansetron  (ZOFRAN ) injection 4 mg (has no administration in time range)  metoCLOPramide  (REGLAN ) injection 10 mg (has no administration in time range)      {Click here for ABCD2, HEART and other calculators REFRESH Note before signing:1}                              Medical Decision Making Amount and/or Complexity of Data Reviewed Labs: ordered.  Risk Prescription drug management.   ***  {Document critical care time when appropriate  Document review of labs and clinical decision tools ie CHADS2VASC2, etc  Document your independent review of radiology  images and any outside records  Document your discussion with family members, caretakers and with consultants  Document social determinants of health affecting pt's care  Document your decision making why or why not admission, treatments were needed:32947:::1}   Final diagnoses:  None    ED Discharge Orders     None

## 2024-03-29 NOTE — ED Triage Notes (Signed)
 Pt had sudden onset of diffuse abdominal pain with nausea and vomiting x 8 hours ago. Pain is radiating to right flank. Denies dysuria

## 2024-03-30 ENCOUNTER — Emergency Department (HOSPITAL_BASED_OUTPATIENT_CLINIC_OR_DEPARTMENT_OTHER)

## 2024-03-30 ENCOUNTER — Inpatient Hospital Stay (HOSPITAL_COMMUNITY)

## 2024-03-30 DIAGNOSIS — Z7982 Long term (current) use of aspirin: Secondary | ICD-10-CM | POA: Diagnosis not present

## 2024-03-30 DIAGNOSIS — Z20822 Contact with and (suspected) exposure to covid-19: Secondary | ICD-10-CM | POA: Diagnosis present

## 2024-03-30 DIAGNOSIS — K56609 Unspecified intestinal obstruction, unspecified as to partial versus complete obstruction: Secondary | ICD-10-CM

## 2024-03-30 DIAGNOSIS — I10 Essential (primary) hypertension: Secondary | ICD-10-CM | POA: Diagnosis present

## 2024-03-30 DIAGNOSIS — G4733 Obstructive sleep apnea (adult) (pediatric): Secondary | ICD-10-CM | POA: Diagnosis present

## 2024-03-30 DIAGNOSIS — K56600 Partial intestinal obstruction, unspecified as to cause: Secondary | ICD-10-CM | POA: Diagnosis present

## 2024-03-30 DIAGNOSIS — Z79899 Other long term (current) drug therapy: Secondary | ICD-10-CM | POA: Diagnosis not present

## 2024-03-30 DIAGNOSIS — Z860100 Personal history of colon polyps, unspecified: Secondary | ICD-10-CM | POA: Diagnosis not present

## 2024-03-30 DIAGNOSIS — G4700 Insomnia, unspecified: Secondary | ICD-10-CM | POA: Diagnosis present

## 2024-03-30 DIAGNOSIS — Z87891 Personal history of nicotine dependence: Secondary | ICD-10-CM | POA: Diagnosis not present

## 2024-03-30 DIAGNOSIS — Z1152 Encounter for screening for COVID-19: Secondary | ICD-10-CM | POA: Diagnosis not present

## 2024-03-30 DIAGNOSIS — D72829 Elevated white blood cell count, unspecified: Secondary | ICD-10-CM | POA: Diagnosis present

## 2024-03-30 DIAGNOSIS — Z8619 Personal history of other infectious and parasitic diseases: Secondary | ICD-10-CM | POA: Diagnosis not present

## 2024-03-30 DIAGNOSIS — F419 Anxiety disorder, unspecified: Secondary | ICD-10-CM | POA: Diagnosis present

## 2024-03-30 DIAGNOSIS — Z79890 Hormone replacement therapy (postmenopausal): Secondary | ICD-10-CM | POA: Diagnosis not present

## 2024-03-30 DIAGNOSIS — R1084 Generalized abdominal pain: Secondary | ICD-10-CM | POA: Diagnosis present

## 2024-03-30 LAB — RESP PANEL BY RT-PCR (RSV, FLU A&B, COVID)  RVPGX2
Influenza A by PCR: NEGATIVE
Influenza B by PCR: NEGATIVE
Resp Syncytial Virus by PCR: NEGATIVE
SARS Coronavirus 2 by RT PCR: NEGATIVE

## 2024-03-30 LAB — TROPONIN T, HIGH SENSITIVITY: Troponin T High Sensitivity: <15 ng/L (ref 0–19)

## 2024-03-30 LAB — LACTIC ACID, PLASMA: Lactic Acid, Venous: 0.9 mmol/L (ref 0.5–1.9)

## 2024-03-30 MED ORDER — SODIUM CHLORIDE 0.9 % IV SOLN: INTRAVENOUS | Status: DC

## 2024-03-30 MED ORDER — ALBUTEROL SULFATE (2.5 MG/3ML) 0.083% IN NEBU: 2.5000 mg | INHALATION_SOLUTION | RESPIRATORY_TRACT | Status: DC | PRN

## 2024-03-30 MED ORDER — ONDANSETRON HCL 4 MG PO TABS: 4.0000 mg | ORAL_TABLET | Freq: Four times a day (QID) | ORAL | Status: DC | PRN

## 2024-03-30 MED ORDER — ATORVASTATIN CALCIUM 20 MG PO TABS
20.0000 mg | ORAL_TABLET | Freq: Every day | ORAL | Status: DC
  Administered 2024-03-31 – 2024-04-01 (×2): 20 mg
  Filled 2024-03-30 (×3): qty 1

## 2024-03-30 MED ORDER — OXYCODONE HCL 5 MG PO TABS
5.0000 mg | ORAL_TABLET | ORAL | Status: DC | PRN
  Administered 2024-03-31: 5 mg via ORAL
  Filled 2024-03-30: qty 1

## 2024-03-30 MED ORDER — ONDANSETRON HCL 4 MG/2ML IJ SOLN
4.0000 mg | Freq: Four times a day (QID) | INTRAMUSCULAR | Status: DC | PRN
  Administered 2024-03-30 – 2024-03-31 (×3): 4 mg via INTRAVENOUS
  Filled 2024-03-30 (×3): qty 2

## 2024-03-30 MED ORDER — HYDROMORPHONE HCL 1 MG/ML IJ SOLN
0.5000 mg | INTRAMUSCULAR | Status: DC | PRN
  Administered 2024-03-30: 0.5 mg via INTRAVENOUS
  Filled 2024-03-30: qty 0.5

## 2024-03-30 MED ORDER — ENOXAPARIN SODIUM 40 MG/0.4ML IJ SOSY
40.0000 mg | PREFILLED_SYRINGE | INTRAMUSCULAR | Status: DC
  Administered 2024-03-30 – 2024-04-01 (×3): 40 mg via SUBCUTANEOUS
  Filled 2024-03-30 (×3): qty 0.4

## 2024-03-30 MED ORDER — BUPROPION HCL ER (XL) 150 MG PO TB24
300.0000 mg | ORAL_TABLET | Freq: Every day | ORAL | Status: DC
Start: 2024-03-30 — End: 2024-03-30

## 2024-03-30 MED ORDER — HYDROMORPHONE HCL 1 MG/ML IJ SOLN
0.5000 mg | INTRAMUSCULAR | Status: DC | PRN
  Administered 2024-03-30 (×2): 1 mg via INTRAVENOUS
  Administered 2024-03-30: 0.5 mg via INTRAVENOUS
  Administered 2024-03-30 – 2024-03-31 (×3): 1 mg via INTRAVENOUS
  Filled 2024-03-30 (×8): qty 1

## 2024-03-30 MED ORDER — LOSARTAN POTASSIUM-HCTZ 100-25 MG PO TABS: 1.0000 | ORAL_TABLET | Freq: Every day | ORAL | Status: DC

## 2024-03-30 MED ORDER — ACETAMINOPHEN 650 MG RE SUPP: 650.0000 mg | Freq: Four times a day (QID) | RECTAL | Status: DC | PRN

## 2024-03-30 MED ORDER — IOHEXOL 300 MG/ML  SOLN
100.0000 mL | Freq: Once | INTRAMUSCULAR | Status: AC | PRN
  Administered 2024-03-30: 100 mL via INTRAVENOUS

## 2024-03-30 MED ORDER — HYDRALAZINE HCL 20 MG/ML IJ SOLN: 5.0000 mg | Freq: Four times a day (QID) | INTRAMUSCULAR | Status: DC | PRN

## 2024-03-30 MED ORDER — DIATRIZOATE MEGLUMINE & SODIUM 66-10 % PO SOLN
90.0000 mL | Freq: Once | ORAL | Status: AC
  Administered 2024-03-30: 90 mL via NASOGASTRIC
  Filled 2024-03-30: qty 90

## 2024-03-30 MED ORDER — ACETAMINOPHEN 325 MG PO TABS
650.0000 mg | ORAL_TABLET | Freq: Four times a day (QID) | ORAL | Status: DC | PRN
  Administered 2024-03-31 – 2024-04-01 (×2): 650 mg via ORAL
  Filled 2024-03-30 (×2): qty 2

## 2024-03-30 MED ORDER — LAMOTRIGINE 25 MG PO TABS
50.0000 mg | ORAL_TABLET | Freq: Two times a day (BID) | ORAL | Status: DC
  Administered 2024-03-30 – 2024-04-01 (×3): 50 mg
  Filled 2024-03-30 (×4): qty 2

## 2024-03-30 MED ORDER — HYDROCHLOROTHIAZIDE 25 MG PO TABS
25.0000 mg | ORAL_TABLET | Freq: Every day | ORAL | Status: DC
  Administered 2024-03-31 – 2024-04-01 (×2): 25 mg
  Filled 2024-03-30 (×3): qty 1

## 2024-03-30 MED ORDER — AMLODIPINE BESYLATE 5 MG PO TABS
2.5000 mg | ORAL_TABLET | Freq: Every day | ORAL | Status: DC
  Administered 2024-03-31: 2.5 mg
  Filled 2024-03-30 (×2): qty 1

## 2024-03-30 MED ORDER — LOSARTAN POTASSIUM 50 MG PO TABS
100.0000 mg | ORAL_TABLET | Freq: Every day | ORAL | Status: DC
  Administered 2024-03-31 – 2024-04-01 (×2): 100 mg
  Filled 2024-03-30 (×3): qty 2

## 2024-03-30 NOTE — H&P (Signed)
 History and Physical  Seth Schmidt FMW:[id number] DOB: [birth year] DOA: 03/29/2024  PCP: Medicine, [facility]   Chief Complaint: abd pain, vomiting   HPI: Seth Schmidt is a 58 y.o. male with medical history significant for OSA, hypertension, anxiety being admitted to the hospital with small bowel obstruction.  He has never had a bowel obstruction in the past, has never had abdominal surgery.  He was feeling vaguely unwell for the last few days, thought maybe he had COVID because he was exposed.  However yesterday afternoon, he developed a headache and then severe diffuse abdominal pain, nausea and vomiting.  Denies any chest pain, fevers, diarrhea, or hematemesis.  Evaluation in the ER as detailed below shows evidence of small bowel obstruction, ER provider has consulted general surgery and NG tube has been placed.  Review of Systems: Please see HPI for pertinent positives and negatives. A complete 10 system review of systems are otherwise negative.  Past Medical History:  Diagnosis Date   Hypertension    OSA (obstructive sleep apnea)    History reviewed. No pertinent surgical history. Social History:  reports that he has quit smoking. His smoking use included cigarettes. He has never used smokeless tobacco. He reports current alcohol use. He reports that he does not use drugs.  No Known Allergies  History reviewed. No pertinent family history.   Prior to Admission medications   Medication Sig Start Date End Date Taking? Authorizing Provider  aspirin 81 MG chewable tablet Chew by mouth.    [provider]  aspirin 81 MG chewable tablet Chew by mouth.    [provider]  atorvastatin  (LIPITOR) 20 MG tablet Take 20 mg by mouth daily. 08/30/21   [provider]  atorvastatin  (LIPITOR) 20 MG tablet atorvastatin  20 mg tablet  TAKE 1 TABLET BY MOUTH EVERY DAY 03/27/21   [provider]  sildenafil (REVATIO) 20 MG tablet Take 60 mg by  mouth daily as needed. 06/02/21   [provider]  tadalafil (CIALIS) 20 MG tablet Take 20 mg by mouth as needed. 12/12/21   [provider]  telmisartan-hydrochlorothiazide  (MICARDIS HCT) 80-12.5 MG tablet Take 1 tablet by mouth daily. 08/11/21   [provider]  testosterone cypionate (DEPOTESTOSTERONE CYPIONATE) 200 MG/ML injection Inject 200 mg into the muscle every 14 (fourteen) days. 07/08/21   [provider]  VASCEPA 1 g capsule Take 1 g by mouth daily. 06/18/21   [provider]  venlafaxine XR (EFFEXOR-XR) 150 MG 24 hr capsule Take 150 mg by mouth daily. 09/08/21   [provider]  WEGOVY 2.4 MG/0.75ML SOAJ Inject into the skin. 09/09/21   [provider]  zolpidem (AMBIEN CR) 12.5 MG CR tablet Take 12.5 mg by mouth at bedtime as needed. 08/28/21   [provider]    Physical Exam: BP (!) 160/88 (BP Location: Right Arm)   Pulse 65   Temp 98 F (36.7 C) (Oral)   Resp 18   SpO2 100%  General:  Alert, oriented, calm, in no acute distress, NG tube in place Cardiovascular: RRR, no murmurs or rubs, no peripheral edema  Respiratory: clear to auscultation bilaterally, no wheezes, no crackles  Abdomen: soft, tender, distended but not tympanic, normal bowel tones heard  Skin: dry, no rashes  Musculoskeletal: no joint effusions, normal range of motion  Psychiatric: appropriate affect, normal speech  Neurologic: extraocular muscles intact, clear speech, moving all extremities with intact sensorium  Labs on Admission:  Basic Metabolic Panel: Recent Labs  Lab 03/29/24 2322  NA 138  K 3.7  CL 98  CO2 22  GLUCOSE 111*  BUN 11  CREATININE 1.15  CALCIUM  10.3   Liver Function Tests: Recent Labs  Lab 03/29/24 2322  AST 23  ALT 19  ALKPHOS 67  BILITOT 0.7  PROT 7.5  ALBUMIN 4.8   Recent Labs  Lab 03/29/24 2322  LIPASE 52*   No results for input(s): AMMONIA in the last 168 hours. CBC: Recent Labs   Lab 03/29/24 2322  WBC 17.5*  HGB 18.5*  HCT 50.5  MCV 90.8  PLT 241   Cardiac Enzymes: No results for input(s): CKTOTAL, CKMB, CKMBINDEX, TROPONINI in the last 168 hours. BNP (last 3 results) No results for input(s): BNP in the last 8760 hours.  ProBNP (last 3 results) No results for input(s): PROBNP in the last 8760 hours.  CBG: No results for input(s): GLUCAP in the last 168 hours.  Radiological Exams on Admission: DG Abdomen 1 View Result Date: 03/30/2024 EXAM: 1 VIEW XRAY OF THE ABDOMEN 03/30/2024 01:50:00 AM CLINICAL HISTORY: post ng tube placement. Post NG tube placement post ng tube placement. Post NG tube placement FINDINGS: LINES, TUBES AND DEVICES: Nasogastric tube tip overlies the proximal body of the stomach. Hypoglossal nerve stimulator overlies the right upper lung zone. BOWEL: Multiple gas-colored dilated loops of small bowel are seen within the visualized upper abdomen. SOFT TISSUES: No free intraperitoneal gas. BONES: No acute osseous abnormality. IMPRESSION: 1. Nasogastric tube tip within the proximal body of the stomach. 2.  small bowel obstruction. Electronically signed by: Dorethia Molt MD 03/30/2024 01:56 AM EDT RP Workstation: [id number]   CT ABDOMEN PELVIS W CONTRAST Result Date: 03/30/2024 EXAM: CT ABDOMEN AND PELVIS WITH CONTRAST 03/30/2024 12:43:00 AM TECHNIQUE: CT of the abdomen and pelvis was performed with the administration of 100 mL of iohexol  (OMNIPAQUE ) 300 MG/ML solution. Multiplanar reformatted images are provided for review. Automated exposure control, iterative reconstruction, and/or weight-based adjustment of the mA/kV was utilized to reduce the radiation dose to as low as reasonably achievable. COMPARISON: 07/04/2005 CLINICAL HISTORY: Abdominal pain, acute, nonlocalized. Pt had sudden onset of diffuse abdominal pain with nausea and vomiting x 8 hours ago. Pain is radiating to right flank. Denies dysuria. FINDINGS: LOWER CHEST: No acute  abnormality. LIVER: The liver is unremarkable. GALLBLADDER AND BILE DUCTS: Gallbladder is unremarkable. No biliary ductal dilatation. SPLEEN: No acute abnormality. PANCREAS: No acute abnormality. ADRENAL GLANDS: No acute abnormality. KIDNEYS, URETERS AND BLADDER: No stones in the kidneys or ureters. No hydronephrosis. No perinephric or periureteral stranding. Urinary bladder is unremarkable. GI AND BOWEL: Mildly distended stomach. Dilated loops of small bowel in the left upper abdomen (image 46) with transition in the lower pelvis (image 64), and decompressed loops in the right lower quadrant (image 49), reflecting at least partial small bowel obstruction. Normal appendix (image 55). PERITONEUM AND RETROPERITONEUM: No ascites. No free air. VASCULATURE: Atherosclerotic calcifications of the abdominal aorta and branch vessels, although patent. LYMPH NODES: No lymphadenopathy. REPRODUCTIVE ORGANS: Prostate is unremarkable. BONES AND SOFT TISSUES: Mild degenerative changes of the lower thoracic spine. No acute osseous abnormality. No focal soft tissue abnormality. IMPRESSION: 1. At least partial small bowel obstruction with transition in the lower pelvis. Electronically signed by: Pinkie Pebbles MD 03/30/2024 12:49 AM EDT RP Workstation: [id number]   Assessment/Plan PIUS BYROM is a 58 y.o. male with medical history significant for OSA, hypertension, anxiety being admitted to the hospital  with small bowel obstruction.  SBO-appears to have a partial small bowel obstruction with transition in the lower pelvis -Inpatient admission -N.p.o. -Continue NG tube to LIS -General Surgery has been consulted -Continue IV fluids, pain and nausea medication as needed  Hypertension-will plan to resume home medications once reconciled, pausing NG tube temporarily per protocol  Leukocytosis-likely reactive due to his SBO, no evidence of acute infection  DVT prophylaxis: Lovenox      Code Status: Full Code  Consults  called: General Surgery  Admission status: The appropriate patient status for this patient is INPATIENT. Inpatient status is judged to be reasonable and necessary in order to provide the required intensity of service to ensure the patient's safety. The patient's presenting symptoms, physical exam findings, and initial radiographic and laboratory data in the context of their chronic comorbidities is felt to place them at high risk for further clinical deterioration. Furthermore, it is not anticipated that the patient will be medically stable for discharge from the hospital within 2 midnights of admission.    I certify that at the point of admission it is my clinical judgment that the patient will require inpatient hospital care spanning beyond 2 midnights from the point of admission due to high intensity of service, high risk for further deterioration and high frequency of surveillance required  Time spent: 53 minutes  Lloyd Ayo CHRISTELLA Gail MD [facility] Pager [phone]  If 7PM-7AM, please contact night-coverage [email or web address] Password [email or web address]  03/30/2024, 7:59 AM

## 2024-03-30 NOTE — Progress Notes (Signed)
 When writer went in to pass evening medication which is losartan  Lipitor and hydrochlorothiazide  pt told me he already took medications I did not witness verify or give his home medications he was about to take his night medication and I explained to him we will give them they are just scheduled

## 2024-03-30 NOTE — Consult Note (Signed)
 Seth Schmidt Vannie May 28, 1966  [id number].    Requesting MD: Haze, MD Chief Complaint/Reason for Consult: SBO  HPI:  Om Lizotte is a 58 year old male with a past medical history of hypertension, obstructive sleep apnea, and anxiety who presents with acute onset abdominal pain.  He tells me that he was exposed to COVID on Friday and had a cough and headache throughout the weekend.  In the last 24 to 48 hours he developed abdominal discomfort followed by his abdomen becoming tight and distended. Pain described as upper abdominal pain that causes him to be unable to stand up straight. Associated symptoms include nausea and vomiting.   He tells me that this morning he has been passing a small amount of gas and last night had a small nonbloody stool.  The patient questions if his symptoms are related to taking Wegovy, which she has been on for 2 years, or Zepbound which he has been on for one year.  He has had successful weight loss on these medications and has been working out consistently.  He denies a history of abdominal surgery.  He gets a colonoscopy every 5 years and has a history of colon polyps but denies known history of malignancy.  He denies fever, chills, urinary symptoms, melena, hematochezia.  Patient tells me he is a former smoker who quit 15 years ago, reports intermittent alcohol use but not daily.  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  ROS: Review of Systems  All other systems reviewed and are negative.   History reviewed. No pertinent family history.  Past Medical History:  Diagnosis Date   Hypertension    OSA (obstructive sleep apnea)     History reviewed. No pertinent surgical history.  Social History:  reports that he has quit smoking. His smoking use included cigarettes. He has never used smokeless tobacco. He reports current alcohol use. He reports that he does not  use drugs.  Allergies: No Known Allergies  Medications Prior to Admission  Medication Sig Dispense Refill   aspirin 81 MG chewable tablet Chew by mouth.     aspirin 81 MG chewable tablet Chew by mouth.     atorvastatin  (LIPITOR) 20 MG tablet Take 20 mg by mouth daily.     atorvastatin  (LIPITOR) 20 MG tablet atorvastatin  20 mg tablet  TAKE 1 TABLET BY MOUTH EVERY DAY     sildenafil (REVATIO) 20 MG tablet Take 60 mg by mouth daily as needed.     tadalafil (CIALIS) 20 MG tablet Take 20 mg by mouth as needed.     telmisartan-hydrochlorothiazide  (MICARDIS HCT) 80-12.5 MG tablet Take 1 tablet by mouth daily.     testosterone cypionate (DEPOTESTOSTERONE CYPIONATE) 200 MG/ML injection Inject 200 mg into the muscle every 14 (fourteen) days.     VASCEPA 1 g capsule Take 1 g by mouth daily.     venlafaxine XR (EFFEXOR-XR) 150 MG 24 hr capsule Take 150 mg by mouth daily.     WEGOVY 2.4 MG/0.75ML SOAJ Inject into the skin.     zolpidem (AMBIEN CR) 12.5 MG CR tablet Take 12.5 mg by mouth at bedtime as needed.       Physical Exam: Blood pressure (!) 160/88, pulse 65, temperature 98 F (36.7 C), temperature source Oral, resp. rate 18, weight 93 kg, SpO2 100%. General: Pleasant white male  laying on hospital bed, appears stated age,  NAD. HEENT: head -normocephalic, atraumatic; Eyes: PERRLA, no conjunctival injection Neck- Trachea is midline CV- RRR, normal S1/S2, no M/R/G, no lower extremity edema Pulm- breathing is non-labored  Abd- soft, NT/ND, appropriate bowel sounds in 4 quadrants, no masses, hernias, or organomegaly.  NG tube in place, currently clamped after receiving Gastrografin . GU- deferred  MSK- UE/LE symmetrical, no cyanosis, clubbing, or edema. Neuro- CN II-XII grossly in tact, no paresthesias. Psych- Alert and Oriented x3 with appropriate affect Skin: warm and dry, no rashes or lesions   Results for orders placed or performed during the hospital encounter of 03/29/24 (from the  past 48 hours)  Lipase, blood     Status: Abnormal   Collection Time: 03/29/24 11:22 PM  Result Value Ref Range   Lipase 52 (H) 11 - 51 U/L    Comment: Performed at [facility], [address] [place], [place] [address]  Comprehensive metabolic panel     Status: Abnormal   Collection Time: 03/29/24 11:22 PM  Result Value Ref Range   Sodium 138 135 - 145 mmol/L   Potassium 3.7 3.5 - 5.1 mmol/L   Chloride 98 98 - 111 mmol/L   CO2 22 22 - 32 mmol/L   Glucose, Bld 111 (H) 70 - 99 mg/dL    Comment: Glucose reference range applies only to samples taken after fasting for at least 8 hours.   BUN 11 6 - 20 mg/dL   Creatinine, Ser 8.84 0.61 - 1.24 mg/dL   Calcium  10.3 8.9 - 10.3 mg/dL   Total Protein 7.5 6.5 - 8.1 g/dL   Albumin 4.8 3.5 - 5.0 g/dL   AST 23 15 - 41 U/L   ALT 19 0 - 44 U/L   Alkaline Phosphatase 67 38 - 126 U/L   Total Bilirubin 0.7 0.0 - 1.2 mg/dL   GFR, Estimated >39 >39 mL/min    Comment: (NOTE) Calculated using the CKD-EPI Creatinine Equation (2021)    Anion gap 17 (H) 5 - 15    Comment: Performed at [facility], [address] [place], [place] [address]  CBC     Status: Abnormal   Collection Time: 03/29/24 11:22 PM  Result Value Ref Range   WBC 17.5 (H) 4.0 - 10.5 K/uL   RBC 5.56 4.22 - 5.81 MIL/uL   Hemoglobin 18.5 (H) 13.0 - 17.0 g/dL   HCT 49.4 60.9 - 47.9 %   MCV 90.8 80.0 - 100.0 fL   MCH 33.3 26.0 - 34.0 pg   MCHC 36.6 (H) 30.0 - 36.0 g/dL   RDW 87.4 88.4 - 84.4 %   Platelets 241 150 - 400 K/uL   nRBC 0.0 0.0 - 0.2 %    Comment: Performed at [facility], [address] [place], [place] [address]  Troponin T, High Sensitivity     Status: None   Collection Time: 03/29/24 11:22 PM  Result Value Ref Range   Troponin T High Sensitivity <15 0 - 19 ng/L    Comment: (NOTE) Biotin concentrations > 1000 ng/mL falsely decrease TnT results.  Serial cardiac troponin measurements are  suggested.  Refer to the Links section for chest pain algorithms and additional  guidance. Performed at [facility], [address], [place], [place] [address]   Resp panel by RT-PCR (RSV, Flu A&B, Covid) Anterior Nasal Swab     Status: None   Collection Time: 03/29/24 11:35 PM   Specimen: Anterior Nasal Swab  Result Value Ref Range   SARS Coronavirus  2 by RT PCR NEGATIVE NEGATIVE    Comment: (NOTE) SARS-CoV-2 target nucleic acids are NOT DETECTED.  The SARS-CoV-2 RNA is generally detectable in upper respiratory specimens during the acute phase of infection. The lowest concentration of SARS-CoV-2 viral copies this assay can detect is 138 copies/mL. A negative result does not preclude SARS-Cov-2 infection and should not be used as the sole basis for treatment or other patient management decisions. A negative result may occur with  improper specimen collection/handling, submission of specimen other than nasopharyngeal swab, presence of viral mutation(s) within the areas targeted by this assay, and inadequate number of viral copies(<138 copies/mL). A negative result must be combined with clinical observations, patient history, and epidemiological information. The expected result is Negative.  Fact Sheet for Patients:  BloggerCourse.com  Fact Sheet for Healthcare Providers:  SeriousBroker.it  This test is no t yet approved or cleared by the [place]  FDA and  has been authorized for detection and/or diagnosis of SARS-CoV-2 by FDA under an Emergency Use Authorization (EUA). This EUA will remain  in effect (meaning this test can be used) for the duration of the COVID-19 declaration under Section 564(b)(1) of the Act, 21 U.S.C.section 360bbb-3(b)(1), unless the authorization is terminated  or revoked sooner.       Influenza A by PCR NEGATIVE NEGATIVE   Influenza B by PCR NEGATIVE NEGATIVE    Comment:  (NOTE) The Xpert Xpress SARS-CoV-2/FLU/RSV plus assay is intended as an aid in the diagnosis of influenza from Nasopharyngeal swab specimens and should not be used as a sole basis for treatment. Nasal washings and aspirates are unacceptable for Xpert Xpress SARS-CoV-2/FLU/RSV testing.  Fact Sheet for Patients: BloggerCourse.com  Fact Sheet for Healthcare Providers: SeriousBroker.it  This test is not yet approved or cleared by the [place]  FDA and has been authorized for detection and/or diagnosis of SARS-CoV-2 by FDA under an Emergency Use Authorization (EUA). This EUA will remain in effect (meaning this test can be used) for the duration of the COVID-19 declaration under Section 564(b)(1) of the Act, 21 U.S.C. section 360bbb-3(b)(1), unless the authorization is terminated or revoked.     Resp Syncytial Virus by PCR NEGATIVE NEGATIVE    Comment: (NOTE) Fact Sheet for Patients: BloggerCourse.com  Fact Sheet for Healthcare Providers: SeriousBroker.it  This test is not yet approved or cleared by the [place]  FDA and has been authorized for detection and/or diagnosis of SARS-CoV-2 by FDA under an Emergency Use Authorization (EUA). This EUA will remain in effect (meaning this test can be used) for the duration of the COVID-19 declaration under Section 564(b)(1) of the Act, 21 U.S.C. section 360bbb-3(b)(1), unless the authorization is terminated or revoked.  Performed at [facility], [address], [place], [place] [address]   Troponin T, High Sensitivity     Status: None   Collection Time: 03/30/24  1:29 AM  Result Value Ref Range   Troponin T High Sensitivity <15 0 - 19 ng/L    Comment: (NOTE) Biotin concentrations > 1000 ng/mL falsely decrease TnT results.  Serial cardiac troponin measurements are suggested.  Refer to the Links section for  chest pain algorithms and additional  guidance. Performed at [facility], [address], [place], [place] [address]   Lactic acid, plasma     Status: None   Collection Time: 03/30/24  1:29 AM  Result Value Ref Range   Lactic Acid, Venous 0.9 0.5 - 1.9 mmol/L    Comment: Performed at [facility],  [address] [place], [place] [address]   DG Abdomen 1 View Result Date: 03/30/2024 EXAM: 1 VIEW XRAY OF THE ABDOMEN 03/30/2024 01:50:00 AM CLINICAL HISTORY: post ng tube placement. Post NG tube placement post ng tube placement. Post NG tube placement FINDINGS: LINES, TUBES AND DEVICES: Nasogastric tube tip overlies the proximal body of the stomach. Hypoglossal nerve stimulator overlies the right upper lung zone. BOWEL: Multiple gas-colored dilated loops of small bowel are seen within the visualized upper abdomen. SOFT TISSUES: No free intraperitoneal gas. BONES: No acute osseous abnormality. IMPRESSION: 1. Nasogastric tube tip within the proximal body of the stomach. 2.  small bowel obstruction. Electronically signed by: Dorethia Molt MD 03/30/2024 01:56 AM EDT RP Workstation: [id number]   CT ABDOMEN PELVIS W CONTRAST Result Date: 03/30/2024 EXAM: CT ABDOMEN AND PELVIS WITH CONTRAST 03/30/2024 12:43:00 AM TECHNIQUE: CT of the abdomen and pelvis was performed with the administration of 100 mL of iohexol  (OMNIPAQUE ) 300 MG/ML solution. Multiplanar reformatted images are provided for review. Automated exposure control, iterative reconstruction, and/or weight-based adjustment of the mA/kV was utilized to reduce the radiation dose to as low as reasonably achievable. COMPARISON: 07/04/2005 CLINICAL HISTORY: Abdominal pain, acute, nonlocalized. Pt had sudden onset of diffuse abdominal pain with nausea and vomiting x 8 hours ago. Pain is radiating to right flank. Denies dysuria. FINDINGS: LOWER CHEST: No acute abnormality. LIVER: The liver is unremarkable. GALLBLADDER AND  BILE DUCTS: Gallbladder is unremarkable. No biliary ductal dilatation. SPLEEN: No acute abnormality. PANCREAS: No acute abnormality. ADRENAL GLANDS: No acute abnormality. KIDNEYS, URETERS AND BLADDER: No stones in the kidneys or ureters. No hydronephrosis. No perinephric or periureteral stranding. Urinary bladder is unremarkable. GI AND BOWEL: Mildly distended stomach. Dilated loops of small bowel in the left upper abdomen (image 46) with transition in the lower pelvis (image 64), and decompressed loops in the right lower quadrant (image 49), reflecting at least partial small bowel obstruction. Normal appendix (image 55). PERITONEUM AND RETROPERITONEUM: No ascites. No free air. VASCULATURE: Atherosclerotic calcifications of the abdominal aorta and branch vessels, although patent. LYMPH NODES: No lymphadenopathy. REPRODUCTIVE ORGANS: Prostate is unremarkable. BONES AND SOFT TISSUES: Mild degenerative changes of the lower thoracic spine. No acute osseous abnormality. No focal soft tissue abnormality. IMPRESSION: 1. At least partial small bowel obstruction with transition in the lower pelvis. Electronically signed by: Pinkie Pebbles MD 03/30/2024 12:49 AM EDT RP Workstation: [id number]      Assessment/Plan pSBO 58 year old male with no history of abdominal surgery who presents with acute onset abdominal pain associated with nausea and vomiting.  His history, labs, imaging, and other provider notes have all been reviewed by me.  CT scan of the abdomen and pelvis shows a PSBO with a possible transition point in the lower pelvis.  He is hemodynamically stable.  On admission his white blood cell count was 17.5, repeat is pending.  He also appeared hemoconcentrated with a hemoglobin of 18.5 compared to 14.8  3 months ago.  Clinically the patient is partially obstructed.  His abdominal exam is overall benign.  Unclear etiology of obstruction-no history of abdominal surgery, no palpable hernias on exam.  no emergent  role for surgery, proceed with SBO protocol with Gastrografin .  CCS will follow closely    I reviewed nursing notes, ED provider notes, hospitalist notes, last 24 h vitals and pain scores, last 48 h intake and output, last 24 h labs and trends, and last 24 h imaging results.  Almarie GORMAN Pringle, [facility] Surgery 03/30/2024, 9:30 AM  Please see Amion for pager number during day hours 7:00am-4:30pm or 7:00am -11:30am on weekends

## 2024-03-31 LAB — BASIC METABOLIC PANEL WITH GFR
Anion gap: 9 (ref 5–15)
BUN: 10 mg/dL (ref 6–20)
CO2: 25 mmol/L (ref 22–32)
Calcium: 8.3 mg/dL — ABNORMAL LOW (ref 8.9–10.3)
Chloride: 105 mmol/L (ref 98–111)
Creatinine, Ser: 1.08 mg/dL (ref 0.61–1.24)
GFR, Estimated: >60 mL/min (ref >60)
Glucose, Bld: 88 mg/dL (ref 70–99)
Potassium: 3.6 mmol/L (ref 3.5–5.1)
Sodium: 138 mmol/L (ref 135–145)

## 2024-03-31 LAB — CBC
HCT: 46.5 % (ref 39.0–52.0)
Hemoglobin: 15.1 g/dL (ref 13.0–17.0)
MCH: 31.5 pg (ref 26.0–34.0)
MCHC: 32.5 g/dL (ref 30.0–36.0)
MCV: 96.9 fL (ref 80.0–100.0)
Platelets: 179 K/uL (ref 150–400)
RBC: 4.8 MIL/uL (ref 4.22–5.81)
RDW: 12.6 % (ref 11.5–15.5)
WBC: 6.5 K/uL (ref 4.0–10.5)
nRBC: 0 % (ref 0.0–0.2)

## 2024-03-31 LAB — HIV ANTIBODY (ROUTINE TESTING W REFLEX): HIV Screen 4th Generation wRfx: NONREACTIVE

## 2024-03-31 MED ORDER — VITAMIN D 25 MCG (1000 UNIT) PO TABS
1000.0000 [IU] | ORAL_TABLET | Freq: Every day | ORAL | Status: DC
  Administered 2024-03-31: 1000 [IU] via ORAL
  Filled 2024-03-31 (×2): qty 1

## 2024-03-31 MED ORDER — BUPROPION HCL ER (XL) 150 MG PO TB24
300.0000 mg | ORAL_TABLET | Freq: Every day | ORAL | Status: DC
  Administered 2024-03-31 – 2024-04-01 (×2): 300 mg via ORAL
  Filled 2024-03-31 (×2): qty 2

## 2024-03-31 MED ORDER — SUVOREXANT 15 MG PO TABS: 15.0000 mg | ORAL_TABLET | Freq: Every evening | ORAL | Status: DC | PRN

## 2024-03-31 NOTE — Progress Notes (Signed)
 PROGRESS NOTE    Seth Schmidt  FMW:[id number] DOB: 21-May-1966 DOA: 03/29/2024 PCP: Medicine, [facility]    Brief Narrative:  58 year old with history of sleep apnea, hypertension, insomnia presented with vague abdominal discomfort, headache, nausea and vomiting for about 2 days.  In the emergency room hemodynamically stable.  Found to have evidence of partial small bowel obstruction and was admitted for symptomatic management.  Clinically improving today.  Subjective: Patient seen and examined.  His abdominal pain has much improved.  He has headache, he thinks Tylenol  and coffee will take care of it.  He hates to use any opiates.  No flatus or bowel movement however follow-up x-ray shows contrast in the colon.  Challenging with liquids.  NG tube taken out. Assessment & Plan:   Partial small bowel obstruction: Some clinical improvement today.  Discontinue IV fluids.  Challenging with liquids.  Surgery following.  Mobilize around.  Anticipate gradual introduction of diet.  Essential hypertension: Resume home medications including amlodipine , hydrochlorothiazide  and losartan .  Anxiety disorder and insomnia: Uses Wellbutrin  and Lamictal  from home.    DVT prophylaxis: enoxaparin  (LOVENOX ) injection 40 mg Start: 03/30/24 1100   Code Status: Full code Family Communication: None at the bedside Disposition Plan: Status is: Inpatient Remains inpatient appropriate because: Tolerance to diet     Consultants:  General Surgery  Procedures:  None  Antimicrobials:  None     Objective: Vitals:   03/30/24 1648 03/30/24 1822 03/30/24 2002 03/31/24 0535  BP: 133/77 139/79 132/82 (!) 147/75  Pulse: 82 71 70 68  Resp: 18 16 18 18   Temp:  98.3 F (36.8 C) 98.5 F (36.9 C) 99.2 F (37.3 C)  TempSrc:  Oral Oral Oral  SpO2:  95% 93% 94%  Weight:        Intake/Output Summary (Last 24 hours) at 03/31/2024 1120 Last data filed at 03/31/2024 1000 Gross per 24 hour   Intake 1472.67 ml  Output 2600 ml  Net -1127.33 ml   Filed Weights   03/30/24 0852  Weight: 93 kg    Examination:  General exam: Appears calm and comfortable  Respiratory system: Clear to auscultation. Respiratory effort normal. Cardiovascular system: S1 & S2 heard, RRR. No JVD, murmurs, rubs, gallops or clicks. No pedal edema. Gastrointestinal system: Soft.  Nontender.  Bowel sound present.   Central nervous system: Alert and oriented. No focal neurological deficits. Extremities: Symmetric 5 x 5 power. Skin: No rashes, lesions or ulcers Psychiatry: Judgement and insight appear normal. Mood & affect appropriate.     Data Reviewed: I have personally reviewed following labs and imaging studies  CBC: Recent Labs  Lab 03/29/24 2322 03/31/24 0415  WBC 17.5* 6.5  HGB 18.5* 15.1  HCT 50.5 46.5  MCV 90.8 96.9  PLT 241 179   Basic Metabolic Panel: Recent Labs  Lab 03/29/24 2322 03/31/24 0415  NA 138 138  K 3.7 3.6  CL 98 105  CO2 22 25  GLUCOSE 111* 88  BUN 11 10  CREATININE 1.15 1.08  CALCIUM  10.3 8.3*   GFR: Estimated Creatinine Clearance: 83.3 mL/min (by C-G formula based on SCr of 1.08 mg/dL). Liver Function Tests: Recent Labs  Lab 03/29/24 2322  AST 23  ALT 19  ALKPHOS 67  BILITOT 0.7  PROT 7.5  ALBUMIN 4.8   Recent Labs  Lab 03/29/24 2322  LIPASE 52*   No results for input(s): AMMONIA in the last 168 hours. Coagulation Profile: No results for input(s): INR, PROTIME in the  last 168 hours. Cardiac Enzymes: No results for input(s): CKTOTAL, CKMB, CKMBINDEX, TROPONINI in the last 168 hours. BNP (last 3 results) No results for input(s): PROBNP in the last 8760 hours. HbA1C: No results for input(s): HGBA1C in the last 72 hours. CBG: No results for input(s): GLUCAP in the last 168 hours. Lipid Profile: No results for input(s): CHOL, HDL, LDLCALC, TRIG, CHOLHDL, LDLDIRECT in the last 72 hours. Thyroid Function  Tests: No results for input(s): TSH, T4TOTAL, FREET4, T3FREE, THYROIDAB in the last 72 hours. Anemia Panel: No results for input(s): VITAMINB12, FOLATE, FERRITIN, TIBC, IRON, RETICCTPCT in the last 72 hours. Sepsis Labs: Recent Labs  Lab 03/30/24 0129  LATICACIDVEN 0.9    Recent Results (from the past 240 hours)  Resp panel by RT-PCR (RSV, Flu A&B, Covid) Anterior Nasal Swab     Status: None   Collection Time: 03/29/24 11:35 PM   Specimen: Anterior Nasal Swab  Result Value Ref Range Status   SARS Coronavirus 2 by RT PCR NEGATIVE NEGATIVE Final    Comment: (NOTE) SARS-CoV-2 target nucleic acids are NOT DETECTED.  The SARS-CoV-2 RNA is generally detectable in upper respiratory specimens during the acute phase of infection. The lowest concentration of SARS-CoV-2 viral copies this assay can detect is 138 copies/mL. A negative result does not preclude SARS-Cov-2 infection and should not be used as the sole basis for treatment or other patient management decisions. A negative result may occur with  improper specimen collection/handling, submission of specimen other than nasopharyngeal swab, presence of viral mutation(s) within the areas targeted by this assay, and inadequate number of viral copies(<138 copies/mL). A negative result must be combined with clinical observations, patient history, and epidemiological information. The expected result is Negative.  Fact Sheet for Patients:  BloggerCourse.com  Fact Sheet for Healthcare Providers:  SeriousBroker.it  This test is no t yet approved or cleared by the [place]  FDA and  has been authorized for detection and/or diagnosis of SARS-CoV-2 by FDA under an Emergency Use Authorization (EUA). This EUA will remain  in effect (meaning this test can be used) for the duration of the COVID-19 declaration under Section 564(b)(1) of the Act, 21 U.S.C.section  360bbb-3(b)(1), unless the authorization is terminated  or revoked sooner.       Influenza A by PCR NEGATIVE NEGATIVE Final   Influenza B by PCR NEGATIVE NEGATIVE Final    Comment: (NOTE) The Xpert Xpress SARS-CoV-2/FLU/RSV plus assay is intended as an aid in the diagnosis of influenza from Nasopharyngeal swab specimens and should not be used as a sole basis for treatment. Nasal washings and aspirates are unacceptable for Xpert Xpress SARS-CoV-2/FLU/RSV testing.  Fact Sheet for Patients: BloggerCourse.com  Fact Sheet for Healthcare Providers: SeriousBroker.it  This test is not yet approved or cleared by the [place]  FDA and has been authorized for detection and/or diagnosis of SARS-CoV-2 by FDA under an Emergency Use Authorization (EUA). This EUA will remain in effect (meaning this test can be used) for the duration of the COVID-19 declaration under Section 564(b)(1) of the Act, 21 U.S.C. section 360bbb-3(b)(1), unless the authorization is terminated or revoked.     Resp Syncytial Virus by PCR NEGATIVE NEGATIVE Final    Comment: (NOTE) Fact Sheet for Patients: BloggerCourse.com  Fact Sheet for Healthcare Providers: SeriousBroker.it  This test is not yet approved or cleared by the [place]  FDA and has been authorized for detection and/or diagnosis of SARS-CoV-2 by FDA under an Emergency Use Authorization (EUA). This EUA will remain in  effect (meaning this test can be used) for the duration of the COVID-19 declaration under Section 564(b)(1) of the Act, 21 U.S.C. section 360bbb-3(b)(1), unless the authorization is terminated or revoked.  Performed at [facility], [address] [place], [place] [address]          Radiology Studies: DG Abd Portable 1V-Small Bowel Obstruction Protocol-initial, 8 hr delay Result Date: 03/30/2024 EXAM: 1  VIEW XRAY OF THE ABDOMEN 03/30/2024 04:48:00 PM COMPARISON: 03/30/2024 at 0148 hours CLINICAL HISTORY: Small bowel obstruction; 8 hour delay FINDINGS: LINES, TUBES AND DEVICES: Enteric tube in place with tip and side port projecting over the gastric lumen. BOWEL: Decreased small bowel dilation. SOFT TISSUES: No opaque urinary calculi. BONES: No acute osseous abnormality. IMPRESSION: 1. Decreased small bowel dilation. 2. Enteric tube appropriately positioned. Electronically signed by: Pinkie Pebbles MD 03/30/2024 07:28 PM EDT RP Workstation: [id number]   DG Abdomen 1 View Result Date: 03/30/2024 EXAM: 1 VIEW XRAY OF THE ABDOMEN 03/30/2024 01:50:00 AM CLINICAL HISTORY: post ng tube placement. Post NG tube placement post ng tube placement. Post NG tube placement FINDINGS: LINES, TUBES AND DEVICES: Nasogastric tube tip overlies the proximal body of the stomach. Hypoglossal nerve stimulator overlies the right upper lung zone. BOWEL: Multiple gas-colored dilated loops of small bowel are seen within the visualized upper abdomen. SOFT TISSUES: No free intraperitoneal gas. BONES: No acute osseous abnormality. IMPRESSION: 1. Nasogastric tube tip within the proximal body of the stomach. 2.  small bowel obstruction. Electronically signed by: Dorethia Molt MD 03/30/2024 01:56 AM EDT RP Workstation: [id number]   CT ABDOMEN PELVIS W CONTRAST Result Date: 03/30/2024 EXAM: CT ABDOMEN AND PELVIS WITH CONTRAST 03/30/2024 12:43:00 AM TECHNIQUE: CT of the abdomen and pelvis was performed with the administration of 100 mL of iohexol  (OMNIPAQUE ) 300 MG/ML solution. Multiplanar reformatted images are provided for review. Automated exposure control, iterative reconstruction, and/or weight-based adjustment of the mA/kV was utilized to reduce the radiation dose to as low as reasonably achievable. COMPARISON: 07/04/2005 CLINICAL HISTORY: Abdominal pain, acute, nonlocalized. Pt had sudden onset of diffuse abdominal pain with nausea and  vomiting x 8 hours ago. Pain is radiating to right flank. Denies dysuria. FINDINGS: LOWER CHEST: No acute abnormality. LIVER: The liver is unremarkable. GALLBLADDER AND BILE DUCTS: Gallbladder is unremarkable. No biliary ductal dilatation. SPLEEN: No acute abnormality. PANCREAS: No acute abnormality. ADRENAL GLANDS: No acute abnormality. KIDNEYS, URETERS AND BLADDER: No stones in the kidneys or ureters. No hydronephrosis. No perinephric or periureteral stranding. Urinary bladder is unremarkable. GI AND BOWEL: Mildly distended stomach. Dilated loops of small bowel in the left upper abdomen (image 46) with transition in the lower pelvis (image 64), and decompressed loops in the right lower quadrant (image 49), reflecting at least partial small bowel obstruction. Normal appendix (image 55). PERITONEUM AND RETROPERITONEUM: No ascites. No free air. VASCULATURE: Atherosclerotic calcifications of the abdominal aorta and branch vessels, although patent. LYMPH NODES: No lymphadenopathy. REPRODUCTIVE ORGANS: Prostate is unremarkable. BONES AND SOFT TISSUES: Mild degenerative changes of the lower thoracic spine. No acute osseous abnormality. No focal soft tissue abnormality. IMPRESSION: 1. At least partial small bowel obstruction with transition in the lower pelvis. Electronically signed by: Pinkie Pebbles MD 03/30/2024 12:49 AM EDT RP Workstation: [id number]        Scheduled Meds:  amLODipine   2.5 mg Per Tube Daily   atorvastatin   20 mg Per Tube Daily   buPROPion   300 mg Oral Daily   cholecalciferol   1,000 Units Oral Daily   enoxaparin  (LOVENOX )  injection  40 mg Subcutaneous Q24H   losartan   100 mg Per Tube Daily   And   hydrochlorothiazide   25 mg Per Tube Daily   lamoTRIgine   50 mg Per Tube BID   Continuous Infusions:   LOS: 1 day    Time spent: 35 minutes    Renato Applebaum, MD [facility]

## 2024-03-31 NOTE — Progress Notes (Signed)
 Subjective: Feels better today. Passing a lot of flatus. XR with no visible contrast, however small bowel distension is improved. Minimal NG output.   Objective: Vital signs in last 24 hours: Temp:  [98.3 F (36.8 C)-99.2 F (37.3 C)] 99.2 F (37.3 C) (09/26 0535) Pulse Rate:  [68-82] 68 (09/26 0535) Resp:  [16-18] 18 (09/26 0535) BP: (132-164)/(75-94) 147/75 (09/26 0535) SpO2:  [93 %-95 %] 94 % (09/26 0535) Last BM Date : 03/29/24  Intake/Output from previous day: 09/25 0701 - 09/26 0700 In: 932.7 [I.V.:902.7; NG/GT:30] Out: 1800 [Urine:1700; Emesis/NG output:100] Intake/Output this shift: Total I/O In: 60 [P.O.:60] Out: 900 [Urine:900]  PE: General: resting comfortably, NAD Neuro: alert and oriented, no focal deficits Resp: normal work of breathing on room air HEENT: NG with minimal clear fluid Abdomen: soft, nondistended, nontender to palpation. Extremities: warm and well-perfused   Lab Results:  Recent Labs    03/29/24 2322 03/31/24 0415  WBC 17.5* 6.5  HGB 18.5* 15.1  HCT 50.5 46.5  PLT 241 179   BMET Recent Labs    03/29/24 2322 03/31/24 0415  NA 138 138  K 3.7 3.6  CL 98 105  CO2 22 25  GLUCOSE 111* 88  BUN 11 10  CREATININE 1.15 1.08  CALCIUM  10.3 8.3*   PT/INR No results for input(s): LABPROT, INR in the last 72 hours. CMP     Component Value Date/Time   NA 138 03/31/2024 0415   K 3.6 03/31/2024 0415   CL 105 03/31/2024 0415   CO2 25 03/31/2024 0415   GLUCOSE 88 03/31/2024 0415   BUN 10 03/31/2024 0415   CREATININE 1.08 03/31/2024 0415   CALCIUM  8.3 (L) 03/31/2024 0415   PROT 7.5 03/29/2024 2322   ALBUMIN 4.8 03/29/2024 2322   AST 23 03/29/2024 2322   ALT 19 03/29/2024 2322   ALKPHOS 67 03/29/2024 2322   BILITOT 0.7 03/29/2024 2322   GFRNONAA >60 03/31/2024 0415   Lipase     Component Value Date/Time   LIPASE 52 (H) 03/29/2024 2322       Studies/Results: DG Abd Portable 1V-Small Bowel Obstruction  Protocol-initial, 8 hr delay Result Date: 03/30/2024 EXAM: 1 VIEW XRAY OF THE ABDOMEN 03/30/2024 04:48:00 PM COMPARISON: 03/30/2024 at 0148 hours CLINICAL HISTORY: Small bowel obstruction; 8 hour delay FINDINGS: LINES, TUBES AND DEVICES: Enteric tube in place with tip and side port projecting over the gastric lumen. BOWEL: Decreased small bowel dilation. SOFT TISSUES: No opaque urinary calculi. BONES: No acute osseous abnormality. IMPRESSION: 1. Decreased small bowel dilation. 2. Enteric tube appropriately positioned. Electronically signed by: Pinkie Pebbles MD 03/30/2024 07:28 PM EDT RP Workstation: [id number]   DG Abdomen 1 View Result Date: 03/30/2024 EXAM: 1 VIEW XRAY OF THE ABDOMEN 03/30/2024 01:50:00 AM CLINICAL HISTORY: post ng tube placement. Post NG tube placement post ng tube placement. Post NG tube placement FINDINGS: LINES, TUBES AND DEVICES: Nasogastric tube tip overlies the proximal body of the stomach. Hypoglossal nerve stimulator overlies the right upper lung zone. BOWEL: Multiple gas-colored dilated loops of small bowel are seen within the visualized upper abdomen. SOFT TISSUES: No free intraperitoneal gas. BONES: No acute osseous abnormality. IMPRESSION: 1. Nasogastric tube tip within the proximal body of the stomach. 2.  small bowel obstruction. Electronically signed by: Dorethia Molt MD 03/30/2024 01:56 AM EDT RP Workstation: [id number]   CT ABDOMEN PELVIS W CONTRAST Result Date: 03/30/2024 EXAM: CT ABDOMEN AND PELVIS WITH CONTRAST 03/30/2024 12:43:00 AM TECHNIQUE: CT of the abdomen  and pelvis was performed with the administration of 100 mL of iohexol  (OMNIPAQUE ) 300 MG/ML solution. Multiplanar reformatted images are provided for review. Automated exposure control, iterative reconstruction, and/or weight-based adjustment of the mA/kV was utilized to reduce the radiation dose to as low as reasonably achievable. COMPARISON: 07/04/2005 CLINICAL HISTORY: Abdominal pain, acute, nonlocalized.  Pt had sudden onset of diffuse abdominal pain with nausea and vomiting x 8 hours ago. Pain is radiating to right flank. Denies dysuria. FINDINGS: LOWER CHEST: No acute abnormality. LIVER: The liver is unremarkable. GALLBLADDER AND BILE DUCTS: Gallbladder is unremarkable. No biliary ductal dilatation. SPLEEN: No acute abnormality. PANCREAS: No acute abnormality. ADRENAL GLANDS: No acute abnormality. KIDNEYS, URETERS AND BLADDER: No stones in the kidneys or ureters. No hydronephrosis. No perinephric or periureteral stranding. Urinary bladder is unremarkable. GI AND BOWEL: Mildly distended stomach. Dilated loops of small bowel in the left upper abdomen (image 46) with transition in the lower pelvis (image 64), and decompressed loops in the right lower quadrant (image 49), reflecting at least partial small bowel obstruction. Normal appendix (image 55). PERITONEUM AND RETROPERITONEUM: No ascites. No free air. VASCULATURE: Atherosclerotic calcifications of the abdominal aorta and branch vessels, although patent. LYMPH NODES: No lymphadenopathy. REPRODUCTIVE ORGANS: Prostate is unremarkable. BONES AND SOFT TISSUES: Mild degenerative changes of the lower thoracic spine. No acute osseous abnormality. No focal soft tissue abnormality. IMPRESSION: 1. At least partial small bowel obstruction with transition in the lower pelvis. Electronically signed by: Pinkie Pebbles MD 03/30/2024 12:49 AM EDT RP Workstation: [id number]    Anti-infectives: Anti-infectives (From admission, onward)    None        Assessment/Plan 58 yo male admitted with abdominal pain, nausea and vomiting. CT concerning for small bowel obstruction, likely partial. Clinically more consistent with gastroenteritis. Gastrografin  seems to have been evacuated via NG, however there is improvement in bowel distension on abdominal plain film this morning. Patient is passing flatus. - Remove NG tube, advance to clear liquid diet - If tolerating clear  liquids, can advance to soft diet     LOS: 1 day    Leonor Dawn, MD [facility] Surgery General, Hepatobiliary and Pancreatic Surgery 03/31/24 9:44 AM

## 2024-03-31 NOTE — Plan of Care (Signed)

## 2024-04-01 NOTE — Discharge Summary (Signed)
 Physician Discharge Summary  Seth Schmidt FMW:[id number] DOB: 06/23/1966 DOA: 03/29/2024  PCP: Medicine, [facility]  Admit date: 03/29/2024 Discharge date: 04/01/2024  Admitted From: Home Disposition: Home  Recommendations for Outpatient Follow-up:  Follow up with PCP in 1-2 weeks    Discharge Condition: Stable CODE STATUS: Full code Diet recommendation: Soft diet, liquids  Discharge summary: 58 year old with history of sleep apnea, hypertension, insomnia, no previous history of abdominal surgeries presented with vague abdominal discomfort, headache, nausea and vomiting for about 2 days. In the emergency room hemodynamically stable. Found to have evidence of partial small bowel obstruction and was admitted for symptomatic management.  Treated conservatively.  Now with regular bowel function and no symptoms.  Seen by surgery.  Going home with advice.  Avoiding constipation.  Resume all home medications.   Discharge Diagnoses:  Principal Problem:   SBO (small bowel obstruction) [facility])    Discharge Instructions  Discharge Instructions     Diet - low sodium heart healthy   Complete by: As directed    Increase activity slowly   Complete by: As directed       Allergies as of 04/01/2024   No Known Allergies      Medication List     STOP taking these medications    losartan -hydrochlorothiazide  100-25 MG tablet Commonly known as: HYZAAR       TAKE these medications    amLODipine  2.5 MG tablet Commonly known as: NORVASC  Take 2.5 mg by mouth daily.   atorvastatin  20 MG tablet Commonly known as: LIPITOR Take 20 mg by mouth daily.   Belsomra  15 MG Tabs Generic drug: Suvorexant  Take 15 mg by mouth at bedtime as needed (sleep).   buPROPion  300 MG 24 hr tablet Commonly known as: WELLBUTRIN  XL Take 300 mg by mouth daily.   cholecalciferol  25 MCG (1000 UNIT) tablet Commonly known as: VITAMIN D3 Take 1,000 Units by mouth daily.   Fish Oil 1200  MG Caps Take 1 capsule by mouth daily at 12 noon.   lamoTRIgine  25 MG tablet Commonly known as: LAMICTAL  Take 50 mg by mouth 2 (two) times daily.   Mounjaro 15 MG/0.5ML Pen Generic drug: tirzepatide Inject 15 mg into the skin once a week.   potassium chloride 10 MEQ CR capsule Commonly known as: MICRO-K Take 10 mEq by mouth daily.   tadalafil 20 MG tablet Commonly known as: CIALIS Take 20 mg by mouth daily as needed for erectile dysfunction.   telmisartan-hydrochlorothiazide  80-25 MG tablet Commonly known as: MICARDIS HCT Take 1 tablet by mouth daily.   testosterone cypionate 200 MG/ML injection Commonly known as: DEPOTESTOSTERONE CYPIONATE Inject 200 mg into the muscle every 14 (fourteen) days.   valACYclovir 500 MG tablet Commonly known as: VALTREX Take 500 mg by mouth 2 (two) times daily as needed (cold sores).   zolpidem 12.5 MG CR tablet Commonly known as: AMBIEN CR Take 12.5 mg by mouth at bedtime as needed for sleep.        No Known Allergies  Consultations: General Surgery   Procedures/Studies: DG Abd Portable 1V-Small Bowel Obstruction Protocol-initial, 8 hr delay Result Date: 03/30/2024 EXAM: 1 VIEW XRAY OF THE ABDOMEN 03/30/2024 04:48:00 PM COMPARISON: 03/30/2024 at 0148 hours CLINICAL HISTORY: Small bowel obstruction; 8 hour delay FINDINGS: LINES, TUBES AND DEVICES: Enteric tube in place with tip and side port projecting over the gastric lumen. BOWEL: Decreased small bowel dilation. SOFT TISSUES: No opaque urinary calculi. BONES: No acute osseous abnormality. IMPRESSION: 1. Decreased small  bowel dilation. 2. Enteric tube appropriately positioned. Electronically signed by: Pinkie Pebbles MD 03/30/2024 07:28 PM EDT RP Workstation: [id number]   DG Abdomen 1 View Result Date: 03/30/2024 EXAM: 1 VIEW XRAY OF THE ABDOMEN 03/30/2024 01:50:00 AM CLINICAL HISTORY: post ng tube placement. Post NG tube placement post ng tube placement. Post NG tube placement  FINDINGS: LINES, TUBES AND DEVICES: Nasogastric tube tip overlies the proximal body of the stomach. Hypoglossal nerve stimulator overlies the right upper lung zone. BOWEL: Multiple gas-colored dilated loops of small bowel are seen within the visualized upper abdomen. SOFT TISSUES: No free intraperitoneal gas. BONES: No acute osseous abnormality. IMPRESSION: 1. Nasogastric tube tip within the proximal body of the stomach. 2.  small bowel obstruction. Electronically signed by: Dorethia Molt MD 03/30/2024 01:56 AM EDT RP Workstation: [id number]   CT ABDOMEN PELVIS W CONTRAST Result Date: 03/30/2024 EXAM: CT ABDOMEN AND PELVIS WITH CONTRAST 03/30/2024 12:43:00 AM TECHNIQUE: CT of the abdomen and pelvis was performed with the administration of 100 mL of iohexol  (OMNIPAQUE ) 300 MG/ML solution. Multiplanar reformatted images are provided for review. Automated exposure control, iterative reconstruction, and/or weight-based adjustment of the mA/kV was utilized to reduce the radiation dose to as low as reasonably achievable. COMPARISON: 07/04/2005 CLINICAL HISTORY: Abdominal pain, acute, nonlocalized. Pt had sudden onset of diffuse abdominal pain with nausea and vomiting x 8 hours ago. Pain is radiating to right flank. Denies dysuria. FINDINGS: LOWER CHEST: No acute abnormality. LIVER: The liver is unremarkable. GALLBLADDER AND BILE DUCTS: Gallbladder is unremarkable. No biliary ductal dilatation. SPLEEN: No acute abnormality. PANCREAS: No acute abnormality. ADRENAL GLANDS: No acute abnormality. KIDNEYS, URETERS AND BLADDER: No stones in the kidneys or ureters. No hydronephrosis. No perinephric or periureteral stranding. Urinary bladder is unremarkable. GI AND BOWEL: Mildly distended stomach. Dilated loops of small bowel in the left upper abdomen (image 46) with transition in the lower pelvis (image 64), and decompressed loops in the right lower quadrant (image 49), reflecting at least partial small bowel obstruction.  Normal appendix (image 55). PERITONEUM AND RETROPERITONEUM: No ascites. No free air. VASCULATURE: Atherosclerotic calcifications of the abdominal aorta and branch vessels, although patent. LYMPH NODES: No lymphadenopathy. REPRODUCTIVE ORGANS: Prostate is unremarkable. BONES AND SOFT TISSUES: Mild degenerative changes of the lower thoracic spine. No acute osseous abnormality. No focal soft tissue abnormality. IMPRESSION: 1. At least partial small bowel obstruction with transition in the lower pelvis. Electronically signed by: Pinkie Pebbles MD 03/30/2024 12:49 AM EDT RP Workstation: [id number]   (Echo, Carotid, EGD, Colonoscopy, ERCP)    Subjective: Patient seen and examined.  Denies any complaints.  Eager to go home.   Discharge Exam: Vitals:   03/31/24 2040 04/01/24 0554  BP: (!) 148/78 (!) 158/87  Pulse: 67 76  Resp: 18 18  Temp: 98.2 F (36.8 C) 98 F (36.7 C)  SpO2: 94% 96%   Vitals:   03/31/24 0535 03/31/24 1412 03/31/24 2040 04/01/24 0554  BP: (!) 147/75 139/87 (!) 148/78 (!) 158/87  Pulse: 68 71 67 76  Resp: 18 18 18 18   Temp: 99.2 F (37.3 C) 98.4 F (36.9 C) 98.2 F (36.8 C) 98 F (36.7 C)  TempSrc: Oral Oral Oral Oral  SpO2: 94% 95% 94% 96%  Weight:        General: Pt is alert, awake, not in acute distress Cardiovascular: RRR, S1/S2 +, no rubs, no gallops Respiratory: CTA bilaterally, no wheezing, no rhonchi Abdominal: Soft, NT, ND, bowel sounds + Extremities: no edema, no cyanosis    The  results of significant diagnostics from this hospitalization (including imaging, microbiology, ancillary and laboratory) are listed below for reference.     Microbiology: Recent Results (from the past 240 hours)  Resp panel by RT-PCR (RSV, Flu A&B, Covid) Anterior Nasal Swab     Status: None   Collection Time: 03/29/24 11:35 PM   Specimen: Anterior Nasal Swab  Result Value Ref Range Status   SARS Coronavirus 2 by RT PCR NEGATIVE NEGATIVE Final    Comment:  (NOTE) SARS-CoV-2 target nucleic acids are NOT DETECTED.  The SARS-CoV-2 RNA is generally detectable in upper respiratory specimens during the acute phase of infection. The lowest concentration of SARS-CoV-2 viral copies this assay can detect is 138 copies/mL. A negative result does not preclude SARS-Cov-2 infection and should not be used as the sole basis for treatment or other patient management decisions. A negative result may occur with  improper specimen collection/handling, submission of specimen other than nasopharyngeal swab, presence of viral mutation(s) within the areas targeted by this assay, and inadequate number of viral copies(<138 copies/mL). A negative result must be combined with clinical observations, patient history, and epidemiological information. The expected result is Negative.  Fact Sheet for Patients:  BloggerCourse.com  Fact Sheet for Healthcare Providers:  SeriousBroker.it  This test is no t yet approved or cleared by the [place]  FDA and  has been authorized for detection and/or diagnosis of SARS-CoV-2 by FDA under an Emergency Use Authorization (EUA). This EUA will remain  in effect (meaning this test can be used) for the duration of the COVID-19 declaration under Section 564(b)(1) of the Act, 21 U.S.C.section 360bbb-3(b)(1), unless the authorization is terminated  or revoked sooner.       Influenza A by PCR NEGATIVE NEGATIVE Final   Influenza B by PCR NEGATIVE NEGATIVE Final    Comment: (NOTE) The Xpert Xpress SARS-CoV-2/FLU/RSV plus assay is intended as an aid in the diagnosis of influenza from Nasopharyngeal swab specimens and should not be used as a sole basis for treatment. Nasal washings and aspirates are unacceptable for Xpert Xpress SARS-CoV-2/FLU/RSV testing.  Fact Sheet for Patients: BloggerCourse.com  Fact Sheet for Healthcare  Providers: SeriousBroker.it  This test is not yet approved or cleared by the [place]  FDA and has been authorized for detection and/or diagnosis of SARS-CoV-2 by FDA under an Emergency Use Authorization (EUA). This EUA will remain in effect (meaning this test can be used) for the duration of the COVID-19 declaration under Section 564(b)(1) of the Act, 21 U.S.C. section 360bbb-3(b)(1), unless the authorization is terminated or revoked.     Resp Syncytial Virus by PCR NEGATIVE NEGATIVE Final    Comment: (NOTE) Fact Sheet for Patients: BloggerCourse.com  Fact Sheet for Healthcare Providers: SeriousBroker.it  This test is not yet approved or cleared by the [place]  FDA and has been authorized for detection and/or diagnosis of SARS-CoV-2 by FDA under an Emergency Use Authorization (EUA). This EUA will remain in effect (meaning this test can be used) for the duration of the COVID-19 declaration under Section 564(b)(1) of the Act, 21 U.S.C. section 360bbb-3(b)(1), unless the authorization is terminated or revoked.  Performed at [facility], [address], [place], [place] [address]      Labs: BNP (last 3 results) No results for input(s): BNP in the last 8760 hours. Basic Metabolic Panel: Recent Labs  Lab 03/29/24 2322 03/31/24 0415  NA 138 138  K 3.7 3.6  CL 98 105  CO2 22 25  GLUCOSE 111* 88  BUN 11 10  CREATININE 1.15 1.08  CALCIUM  10.3 8.3*   Liver Function Tests: Recent Labs  Lab 03/29/24 2322  AST 23  ALT 19  ALKPHOS 67  BILITOT 0.7  PROT 7.5  ALBUMIN 4.8   Recent Labs  Lab 03/29/24 2322  LIPASE 52*   No results for input(s): AMMONIA in the last 168 hours. CBC: Recent Labs  Lab 03/29/24 2322 03/31/24 0415  WBC 17.5* 6.5  HGB 18.5* 15.1  HCT 50.5 46.5  MCV 90.8 96.9  PLT 241 179   Cardiac Enzymes: No results for input(s): CKTOTAL,  CKMB, CKMBINDEX, TROPONINI in the last 168 hours. BNP: Invalid input(s): POCBNP CBG: No results for input(s): GLUCAP in the last 168 hours. D-Dimer No results for input(s): DDIMER in the last 72 hours. Hgb A1c No results for input(s): HGBA1C in the last 72 hours. Lipid Profile No results for input(s): CHOL, HDL, LDLCALC, TRIG, CHOLHDL, LDLDIRECT in the last 72 hours. Thyroid function studies No results for input(s): TSH, T4TOTAL, T3FREE, THYROIDAB in the last 72 hours.  Invalid input(s): FREET3 Anemia work up No results for input(s): VITAMINB12, FOLATE, FERRITIN, TIBC, IRON, RETICCTPCT in the last 72 hours. Urinalysis No results found for: COLORURINE, APPEARANCEUR, LABSPEC, PHURINE, GLUCOSEU, HGBUR, BILIRUBINUR, KETONESUR, PROTEINUR, UROBILINOGEN, NITRITE, LEUKOCYTESUR Sepsis Labs Recent Labs  Lab 03/29/24 2322 03/31/24 0415  WBC 17.5* 6.5   Microbiology Recent Results (from the past 240 hours)  Resp panel by RT-PCR (RSV, Flu A&B, Covid) Anterior Nasal Swab     Status: None   Collection Time: 03/29/24 11:35 PM   Specimen: Anterior Nasal Swab  Result Value Ref Range Status   SARS Coronavirus 2 by RT PCR NEGATIVE NEGATIVE Final    Comment: (NOTE) SARS-CoV-2 target nucleic acids are NOT DETECTED.  The SARS-CoV-2 RNA is generally detectable in upper respiratory specimens during the acute phase of infection. The lowest concentration of SARS-CoV-2 viral copies this assay can detect is 138 copies/mL. A negative result does not preclude SARS-Cov-2 infection and should not be used as the sole basis for treatment or other patient management decisions. A negative result may occur with  improper specimen collection/handling, submission of specimen other than nasopharyngeal swab, presence of viral mutation(s) within the areas targeted by this assay, and inadequate number of viral copies(<138 copies/mL). A negative  result must be combined with clinical observations, patient history, and epidemiological information. The expected result is Negative.  Fact Sheet for Patients:  BloggerCourse.com  Fact Sheet for Healthcare Providers:  SeriousBroker.it  This test is no t yet approved or cleared by the [place]  FDA and  has been authorized for detection and/or diagnosis of SARS-CoV-2 by FDA under an Emergency Use Authorization (EUA). This EUA will remain  in effect (meaning this test can be used) for the duration of the COVID-19 declaration under Section 564(b)(1) of the Act, 21 U.S.C.section 360bbb-3(b)(1), unless the authorization is terminated  or revoked sooner.       Influenza A by PCR NEGATIVE NEGATIVE Final   Influenza B by PCR NEGATIVE NEGATIVE Final    Comment: (NOTE) The Xpert Xpress SARS-CoV-2/FLU/RSV plus assay is intended as an aid in the diagnosis of influenza from Nasopharyngeal swab specimens and should not be used as a sole basis for treatment. Nasal washings and aspirates are unacceptable for Xpert Xpress SARS-CoV-2/FLU/RSV testing.  Fact Sheet for Patients: BloggerCourse.com  Fact Sheet for Healthcare Providers: SeriousBroker.it  This test is not yet approved or cleared by the [place]  FDA and has been  authorized for detection and/or diagnosis of SARS-CoV-2 by FDA under an Emergency Use Authorization (EUA). This EUA will remain in effect (meaning this test can be used) for the duration of the COVID-19 declaration under Section 564(b)(1) of the Act, 21 U.S.C. section 360bbb-3(b)(1), unless the authorization is terminated or revoked.     Resp Syncytial Virus by PCR NEGATIVE NEGATIVE Final    Comment: (NOTE) Fact Sheet for Patients: BloggerCourse.com  Fact Sheet for Healthcare Providers: SeriousBroker.it  This  test is not yet approved or cleared by the [place]  FDA and has been authorized for detection and/or diagnosis of SARS-CoV-2 by FDA under an Emergency Use Authorization (EUA). This EUA will remain in effect (meaning this test can be used) for the duration of the COVID-19 declaration under Section 564(b)(1) of the Act, 21 U.S.C. section 360bbb-3(b)(1), unless the authorization is terminated or revoked.  Performed at [facility], [address], [place], [place] [address]      Time coordinating discharge: 28 minutes  SIGNED:   Renato Applebaum, MD  [facility] 04/01/2024, 10:04 AM

## 2024-04-01 NOTE — Progress Notes (Signed)
 Assessment unchanged. Verbalized understanding of dc instructions through teach back including when to call doctor. Discharged via foot per pt request accompanied by girlfriend to front entrance.

## 2024-04-01 NOTE — Plan of Care (Signed)

## 2024-04-01 NOTE — Plan of Care (Signed)
   Problem: Education: Goal: Knowledge of General Education information will improve Description Including pain rating scale, medication(s)/side effects and non-pharmacologic comfort measures Outcome: Progressing

## 2024-04-01 NOTE — Progress Notes (Signed)
   Subjective/Chief Complaint: Patient passing gas.  Denies abdominal pain, nausea, vomiting.  He is tolerating his diet well.   Objective: Vital signs in last 24 hours: Temp:  [98 F (36.7 C)-98.4 F (36.9 C)] 98 F (36.7 C) (09/27 0554) Pulse Rate:  [67-76] 76 (09/27 0554) Resp:  [18] 18 (09/27 0554) BP: (139-158)/(78-87) 158/87 (09/27 0554) SpO2:  [94 %-96 %] 96 % (09/27 0554) Last BM Date : 03/29/24  Intake/Output from previous day: 09/26 0701 - 09/27 0700 In: 2120 [P.O.:2120] Out: 900 [Urine:900] Intake/Output this shift: No intake/output data recorded.  GI: Abdomen soft nontender without rebound or guarding.  Lab Results:  Recent Labs    03/29/24 2322 03/31/24 0415  WBC 17.5* 6.5  HGB 18.5* 15.1  HCT 50.5 46.5  PLT 241 179   BMET Recent Labs    03/29/24 2322 03/31/24 0415  NA 138 138  K 3.7 3.6  CL 98 105  CO2 22 25  GLUCOSE 111* 88  BUN 11 10  CREATININE 1.15 1.08  CALCIUM  10.3 8.3*   PT/INR No results for input(s): LABPROT, INR in the last 72 hours. ABG No results for input(s): PHART, HCO3 in the last 72 hours.  Invalid input(s): PCO2, PO2  Studies/Results: DG Abd Portable 1V-Small Bowel Obstruction Protocol-initial, 8 hr delay Result Date: 03/30/2024 EXAM: 1 VIEW XRAY OF THE ABDOMEN 03/30/2024 04:48:00 PM COMPARISON: 03/30/2024 at 0148 hours CLINICAL HISTORY: Small bowel obstruction; 8 hour delay FINDINGS: LINES, TUBES AND DEVICES: Enteric tube in place with tip and side port projecting over the gastric lumen. BOWEL: Decreased small bowel dilation. SOFT TISSUES: No opaque urinary calculi. BONES: No acute osseous abnormality. IMPRESSION: 1. Decreased small bowel dilation. 2. Enteric tube appropriately positioned. Electronically signed by: Pinkie Pebbles MD 03/30/2024 07:28 PM EDT RP Workstation: [id number]    Anti-infectives: Anti-infectives (From admission, onward)    None       Assessment/Plan:  58 yo male admitted with  abdominal pain, nausea and vomiting. CT concerning for small bowel obstruction, likely partial. Clinically more consistent with gastroenteritis. Gastrografin  seems to have been evacuated via NG, however there is improvement in bowel distension on abdominal plain film this morning. Patient is passing flatus.    He looks well.  He does have issues with constipation and slow GI transit from medications he takes.  He is stable from a surgical standpoint can be discharged per medical service.  Surgery to sign off     LOS: 2 days    Debby DELENA Shipper 04/01/2024 Straightforward decision making
